# Patient Record
Sex: Male | Born: 1950 | Race: White | Hispanic: No | Marital: Married | Smoking: Former smoker
Health system: Southern US, Community
[De-identification: ages and names within clinical notes are randomized; demographics above are authoritative.]

## PROBLEM LIST (undated history)

## (undated) DIAGNOSIS — I2510 Atherosclerotic heart disease of native coronary artery without angina pectoris: Secondary | ICD-10-CM

## (undated) DIAGNOSIS — R351 Nocturia: Secondary | ICD-10-CM

## (undated) DIAGNOSIS — C679 Malignant neoplasm of bladder, unspecified: Secondary | ICD-10-CM

## (undated) DIAGNOSIS — Z8546 Personal history of malignant neoplasm of prostate: Secondary | ICD-10-CM

## (undated) DIAGNOSIS — K859 Acute pancreatitis without necrosis or infection, unspecified: Secondary | ICD-10-CM

## (undated) DIAGNOSIS — I959 Hypotension, unspecified: Secondary | ICD-10-CM

## (undated) DIAGNOSIS — D8989 Other specified disorders involving the immune mechanism, not elsewhere classified: Secondary | ICD-10-CM

## (undated) DIAGNOSIS — R350 Frequency of micturition: Secondary | ICD-10-CM

## (undated) DIAGNOSIS — Z8719 Personal history of other diseases of the digestive system: Secondary | ICD-10-CM

## (undated) DIAGNOSIS — R319 Hematuria, unspecified: Secondary | ICD-10-CM

## (undated) DIAGNOSIS — R3915 Urgency of urination: Secondary | ICD-10-CM

## (undated) DIAGNOSIS — N289 Disorder of kidney and ureter, unspecified: Secondary | ICD-10-CM

## (undated) DIAGNOSIS — I219 Acute myocardial infarction, unspecified: Secondary | ICD-10-CM

## (undated) HISTORY — DX: Personal history of other diseases of the digestive system: Z87.19

## (undated) HISTORY — PX: ILEOSTOMY: SHX1783

## (undated) HISTORY — PX: NEPHROSTOMY: SHX1014

## (undated) HISTORY — PX: ABDOMINAL SURGERY: SHX537

## (undated) HISTORY — DX: Atherosclerotic heart disease of native coronary artery without angina pectoris: I25.10

---

## 2011-03-03 HISTORY — PX: OTHER SURGICAL HISTORY: SHX169

## 2013-06-20 ENCOUNTER — Other Ambulatory Visit: Payer: Self-pay | Admitting: Urology

## 2013-06-23 ENCOUNTER — Encounter (HOSPITAL_BASED_OUTPATIENT_CLINIC_OR_DEPARTMENT_OTHER): Payer: Self-pay | Admitting: *Deleted

## 2013-06-23 NOTE — H&P (Signed)
Reason For Visit   Mr Aumiller presents today as a more urgent walk-in for concerns over a bladder mass and gross hematuria. Again, he was seen by myself on one occasion approximately 6 months ago for followup of prostate cancer. He was approximately 2 years status post seed implantation for favorable risk prostate cancer. His PSA had nadired at 1.0 but when we checked it, it was up to 2.4. He felt that that may represent a PSA bounce and that we would watch things with repeat in 6 months, which he is about to be due for. In the interim he came in here recently complaining of some increased urinary urgency and dysuria. It appears that he had at least a mild prostatitis and was treated with a course of antibiotics. His voiding symptoms improved, but then he developed some terminal gross hematuria while in [place], [place]. He presented to a local emergency room where a CT did reveal a 4 cm left lateral bladder mass consistent with a potential transitional cell carcinoma. I reviewed those CT images and concurred. There did not appear to be any evidence of obvious metastatic disease or extension outside the bladder. He is here for additional assessment, primarily with cystoscopy.     History of Present Illness   Past Gu Hx:  Mr. Labrosse presented in November of 2014 to establish with a local urologist for ongoing evaluation and followup of prostate cancer. Mr. Rostad is currently 63 years of age. He was diagnosed with prostate cancer in late 2012 and was under the care of a urologist and subsequently a radiologist oncologist. We currently do not have any of his official records, but he is an excellent historian. He was diagnosed with what sounds like low-risk/favorable clinical stage T1c prostate cancer. PSA was 3.5 and he apparently had a low-volume Gleason 6 cancer. He ended up electing active treatment and chose brachytherapy with Iodine-125. That procedure went well and was without obvious complications. He did  have typical increased irritative voiding symptoms, which settled down and for the most part he had minimal to no voiding complaints. He has had some very mild erectile dysfunction for which he will take an occasional Cialis. In early 2014 , PSA was apparently 1.0 which represented a Nadir level. He has been told in the past that he has some slight renal insufficiency. He also tells me he has passed some small stone-like material from his bladder. He has had no other significant urologic issues or concerns. He generally enjoys excellent health. He has not established with a primary care physician at this time.   Past Medical History Problems  1. History of gastroesophageal reflux (GERD) (V12.79) 2. History of prostate cancer (V10.46)  Surgical History Problems  1. History of Prostate Surgery  Current Meds 1. Cialis 20 MG Oral Tablet;  Therapy: (Recorded:13Nov2014) to Recorded 2. Doxycycline Monohydrate 100 MG Oral Tablet; Take 1 tablet twice daily;  Therapy: [id number] to (Evaluate:03Apr2015)  Requested for: [id number]; Last  Rx:24Mar2015 Ordered 3. SMZ-TMP DS 800-160 MG Oral Tablet; TAKE 1 TABLET TWICE DAILY;  Therapy: 01Apr2015 to (Evaluate:11Apr2015)  Requested for: 01Apr2015; Last  Rx:01Apr2015 Ordered 4. Tamsulosin HCl - 0.4 MG Oral Capsule; TAKE 1 CAPSULE Daily;  Therapy: 01Apr2015 to (Evaluate:01May2015)  Requested for: 01Apr2015; Last  Rx:01Apr2015 Ordered  Allergies Medication  1. No Known Drug Allergies  Family History Problems  1. Family history of prostate cancer (N82.95) : Father  Social History Problems  1. Alcohol use 2. Caffeine use (V49.89) 3. Married 4.  Never a smoker 5. Retired 86. Two children   sons  Review of Systems Genitourinary, constitutional, skin, eye, otolaryngeal, hematologic/lymphatic, cardiovascular, pulmonary, endocrine, musculoskeletal, gastrointestinal, neurological and psychiatric system(s) were reviewed and pertinent findings if present  are noted.  Genitourinary: urinary frequency, nocturia, hematuria and erectile dysfunction.  Gastrointestinal: heartburn.  Musculoskeletal: back pain.    Results/Data Urine [Data Includes: Last 1 Day]   20Apr2015 COLOR AMBER  APPEARANCE CLOUDY  SPECIFIC GRAVITY 1.020  pH 5.5  GLUCOSE NEG mg/dL BILIRUBIN NEG  KETONE NEG mg/dL BLOOD LARGE  PROTEIN 100 mg/dL UROBILINOGEN 0.2 mg/dL NITRITE NEG  LEUKOCYTE ESTERASE SMALL  SQUAMOUS EPITHELIAL/HPF RARE  WBC 11-20 WBC/hpf RBC TNTC RBC/hpf BACTERIA MODERATE  CRYSTALS NONE SEEN  CASTS NONE SEEN   Physical Exam Constitutional: Well nourished and well developed . No acute distress.  ENT:. The ears and nose are normal in appearance.  Neck: The appearance of the neck is normal and no neck mass is present.  Pulmonary: No respiratory distress and normal respiratory rhythm and effort.  Cardiovascular: Heart rate and rhythm are normal . No peripheral edema.  Abdomen: The abdomen is soft and nontender. No masses are palpated. No CVA tenderness. No hernias are palpable. No hepatosplenomegaly noted.  Rectal: Rectal exam demonstrates normal sphincter tone, no tenderness and no masses. Prostate size is estimated to be 20 g. Normal rectal tone, no rectal masses, prostate is smooth, symmetric and non-tender. The prostate has no nodularity and is not tender. The left seminal vesicle is nonpalpable. The right seminal vesicle is nonpalpable. The perineum is normal on inspection.  Genitourinary: Examination of the penis demonstrates no discharge, no masses, no lesions and a normal meatus. The scrotum is without lesions. The right epididymis is palpably normal and non-tender. The left epididymis is palpably normal and non-tender. The right testis is non-tender and without masses. The left testis is non-tender and without masses.  Lymphatics: The femoral and inguinal nodes are not enlarged or tender.  Skin: Normal skin turgor, no visible rash and no visible  skin lesions.  Neuro/Psych:. Mood and affect are appropriate.    Procedure  Procedure: Cystoscopy  Chaperone Present: laura.  Indication: Hematuria. Lower Urinary Tract Symptoms. Bladder Mass.  Informed Consent: Risks, benefits, and potential adverse events were discussed and informed consent was obtained from the patient . Specific risks including, but not limited to bleeding, infection, pain, allergic reaction etc. were explained.  Prep: The patient was prepped with betadine.  Anesthesia:. Local anesthesia was administered intraurethrally with 2% lidocaine jelly.  Antibiotic prophylaxis: Ciprofloxacin.  Procedure Note:  Urethral meatus:. No abnormalities.  Anterior urethra: No abnormalities.  Prostatic urethra:. The lateral and median prostatic lobes were enlarged.  Bladder: Visulization was obscured due to blood. A solitary tumor was visualized in the bladder. A nodular tumor was seen in the bladder measuring approximately 5 cm in size. This tumor was located on the left side of the bladder. The patient tolerated the procedure well.  Complications: None.    Assessment Assessed  1. Bladder mass (596.89) 2. Gross hematuria (599.71) 3. Prostate cancer (185) 4. Bladder cancer (188.9)  Plan Bladder mass  1. Cysto; Status:Complete;   Done: 20Apr2015 Health Maintenance  2. UA With REFLEX; [Do Not Release]; Status:Resulted - Requires Verification;   Done:  20Apr2015 10:54AM  Discussion/Summary   1. Bladder mass with gross hematuria. I explained to Mr Dudenhoeffer's wife that this mass has an exceedingly high likelihood of being a transitional cell carcinoma. It has a nodular appearance and  involves the left lateral wall of the bladder. It is well away from the ureteral orifices. Fortunately there does not appear to be obvious gross extension outside the bladder on CT imaging. Next assessment should be transurethral resection. We would be hopeful that this can be resected in its entirety and  we will do our best to get very accurate pathologic staging. We will consider mitomycin instillation of the bladder status post procedure after resection. Obviously, additional evaluation and/or treatment will depend primarily on staging and grading of this tumor.   2. History of adenocarcinoma of the prostate. He had a reasonable PSA response initially and then a bounce from 1 to 2.4. We will need to reassess his PSA. Now that he is 2 years out from seeds, we should start to see a definitive decline in his PSA and hopefully reaching levels that we typically expect after seed implantation.

## 2013-06-23 NOTE — Progress Notes (Signed)
NPO AFTER MN. ARRIVE AT 0915.  NEEDS HG AND PSA.  REVIEWED [facility] GUIDELINES .

## 2013-06-26 ENCOUNTER — Encounter (HOSPITAL_BASED_OUTPATIENT_CLINIC_OR_DEPARTMENT_OTHER)
Admission: RE | Disposition: A | Discharge status: Home / Self Care | Payer: Self-pay | Source: Ambulatory Visit | Attending: Urology

## 2013-06-26 ENCOUNTER — Encounter (HOSPITAL_BASED_OUTPATIENT_CLINIC_OR_DEPARTMENT_OTHER): Payer: BC Managed Care – PPO | Admitting: Anesthesiology

## 2013-06-26 ENCOUNTER — Ambulatory Visit (HOSPITAL_BASED_OUTPATIENT_CLINIC_OR_DEPARTMENT_OTHER)
Admission: RE | Admit: 2013-06-26 | Discharge: 2013-06-27 | Disposition: A | Discharge status: Home / Self Care | Payer: BC Managed Care – PPO | Source: Ambulatory Visit | Attending: Urology | Admitting: Urology

## 2013-06-26 ENCOUNTER — Encounter (HOSPITAL_BASED_OUTPATIENT_CLINIC_OR_DEPARTMENT_OTHER): Payer: Self-pay

## 2013-06-26 ENCOUNTER — Ambulatory Visit (HOSPITAL_BASED_OUTPATIENT_CLINIC_OR_DEPARTMENT_OTHER): Payer: BC Managed Care – PPO | Admitting: Anesthesiology

## 2013-06-26 DIAGNOSIS — C672 Malignant neoplasm of lateral wall of bladder: Secondary | ICD-10-CM | POA: Insufficient documentation

## 2013-06-26 DIAGNOSIS — Z8546 Personal history of malignant neoplasm of prostate: Secondary | ICD-10-CM | POA: Insufficient documentation

## 2013-06-26 DIAGNOSIS — Z923 Personal history of irradiation: Secondary | ICD-10-CM | POA: Insufficient documentation

## 2013-06-26 DIAGNOSIS — Z87891 Personal history of nicotine dependence: Secondary | ICD-10-CM | POA: Insufficient documentation

## 2013-06-26 DIAGNOSIS — C679 Malignant neoplasm of bladder, unspecified: Secondary | ICD-10-CM

## 2013-06-26 HISTORY — PX: TRANSURETHRAL RESECTION OF BLADDER TUMOR: SHX2575

## 2013-06-26 HISTORY — DX: Frequency of micturition: R35.0

## 2013-06-26 HISTORY — DX: Nocturia: R35.1

## 2013-06-26 HISTORY — DX: Urgency of urination: R39.15

## 2013-06-26 HISTORY — DX: Personal history of malignant neoplasm of prostate: Z85.46

## 2013-06-26 HISTORY — DX: Hematuria, unspecified: R31.9

## 2013-06-26 HISTORY — DX: Malignant neoplasm of bladder, unspecified: C67.9

## 2013-06-26 LAB — POCT HEMOGLOBIN-HEMACUE: Hemoglobin: 15.1 g/dL (ref 13.0–17.0)

## 2013-06-26 SURGERY — TURBT (TRANSURETHRAL RESECTION OF BLADDER TUMOR)
Anesthesia: General | Site: Bladder

## 2013-06-26 MED ORDER — LACTATED RINGERS IV SOLN
INTRAVENOUS | Status: DC
  Administered 2013-06-26 (×2): via INTRAVENOUS
  Filled 2013-06-26: qty 1000

## 2013-06-26 MED ORDER — KETOROLAC TROMETHAMINE 30 MG/ML IJ SOLN
15.0000 mg | Freq: Once | INTRAMUSCULAR | Status: AC | PRN
  Filled 2013-06-26: qty 1

## 2013-06-26 MED ORDER — MORPHINE SULFATE 4 MG/ML IJ SOLN
INTRAMUSCULAR | Status: AC
  Filled 2013-06-26: qty 1

## 2013-06-26 MED ORDER — CEFAZOLIN SODIUM 1-5 GM-% IV SOLN
1.0000 g | Freq: Three times a day (TID) | INTRAVENOUS | Status: AC
  Administered 2013-06-26 (×2): 1 g via INTRAVENOUS
  Filled 2013-06-26: qty 50

## 2013-06-26 MED ORDER — ACETAMINOPHEN 10 MG/ML IV SOLN
INTRAVENOUS | Status: DC | PRN
  Administered 2013-06-26: 1000 mg via INTRAVENOUS

## 2013-06-26 MED ORDER — ONDANSETRON HCL 4 MG/2ML IJ SOLN
INTRAMUSCULAR | Status: DC | PRN
  Administered 2013-06-26: 4 mg via INTRAVENOUS

## 2013-06-26 MED ORDER — DEXAMETHASONE SODIUM PHOSPHATE 4 MG/ML IJ SOLN
INTRAMUSCULAR | Status: DC | PRN
  Administered 2013-06-26: 10 mg via INTRAVENOUS

## 2013-06-26 MED ORDER — ROCURONIUM BROMIDE 100 MG/10ML IV SOLN
INTRAVENOUS | Status: DC | PRN
  Administered 2013-06-26: 40 mg via INTRAVENOUS
  Administered 2013-06-26 (×3): 10 mg via INTRAVENOUS

## 2013-06-26 MED ORDER — LIDOCAINE HCL (CARDIAC) 20 MG/ML IV SOLN
INTRAVENOUS | Status: DC | PRN
  Administered 2013-06-26: 100 mg via INTRAVENOUS

## 2013-06-26 MED ORDER — GLYCOPYRROLATE 0.2 MG/ML IJ SOLN
INTRAMUSCULAR | Status: DC | PRN
  Administered 2013-06-26: .8 mg via INTRAVENOUS
  Administered 2013-06-26: 0.2 mg via INTRAVENOUS

## 2013-06-26 MED ORDER — BELLADONNA ALKALOIDS-OPIUM 16.2-60 MG RE SUPP
RECTAL | Status: AC
  Filled 2013-06-26: qty 1

## 2013-06-26 MED ORDER — PROMETHAZINE HCL 25 MG/ML IJ SOLN
6.2500 mg | INTRAMUSCULAR | Status: DC | PRN
  Filled 2013-06-26: qty 1

## 2013-06-26 MED ORDER — KCL IN DEXTROSE-NACL 20-5-0.45 MEQ/L-%-% IV SOLN
INTRAVENOUS | Status: DC
  Administered 2013-06-26 – 2013-06-27 (×2): via INTRAVENOUS
  Filled 2013-06-26: qty 1000

## 2013-06-26 MED ORDER — MORPHINE SULFATE 2 MG/ML IJ SOLN
2.0000 mg | INTRAMUSCULAR | Status: DC | PRN
  Administered 2013-06-26: 2 mg via INTRAVENOUS
  Filled 2013-06-26: qty 2

## 2013-06-26 MED ORDER — NEOSTIGMINE METHYLSULFATE 1 MG/ML IJ SOLN
INTRAMUSCULAR | Status: DC | PRN
  Administered 2013-06-26: 5 mg via INTRAVENOUS

## 2013-06-26 MED ORDER — CIPROFLOXACIN IN D5W 400 MG/200ML IV SOLN
400.0000 mg | Freq: Two times a day (BID) | INTRAVENOUS | Status: AC
  Filled 2013-06-26: qty 200

## 2013-06-26 MED ORDER — CIPROFLOXACIN IN D5W 400 MG/200ML IV SOLN
400.0000 mg | INTRAVENOUS | Status: AC
  Administered 2013-06-26: 400 mg via INTRAVENOUS
  Filled 2013-06-26: qty 200

## 2013-06-26 MED ORDER — SUCCINYLCHOLINE CHLORIDE 20 MG/ML IJ SOLN
INTRAMUSCULAR | Status: DC | PRN
  Administered 2013-06-26: 120 mg via INTRAVENOUS

## 2013-06-26 MED ORDER — OXYCODONE-ACETAMINOPHEN 5-325 MG PO TABS
1.0000 | ORAL_TABLET | ORAL | Status: DC | PRN
  Administered 2013-06-26 – 2013-06-27 (×2): 1 via ORAL
  Filled 2013-06-26: qty 2

## 2013-06-26 MED ORDER — PROPOFOL 10 MG/ML IV BOLUS
INTRAVENOUS | Status: DC | PRN
  Administered 2013-06-26: 200 mg via INTRAVENOUS

## 2013-06-26 MED ORDER — FENTANYL CITRATE 0.05 MG/ML IJ SOLN
INTRAMUSCULAR | Status: AC
  Filled 2013-06-26: qty 4

## 2013-06-26 MED ORDER — BELLADONNA ALKALOIDS-OPIUM 16.2-60 MG RE SUPP
RECTAL | Status: DC | PRN
  Administered 2013-06-26: 1 via RECTAL

## 2013-06-26 MED ORDER — SODIUM CHLORIDE 0.9 % IR SOLN
Status: DC | PRN
  Administered 2013-06-26: 6000 mL via INTRAVESICAL
  Administered 2013-06-26: 31000 mL via INTRAVESICAL

## 2013-06-26 MED ORDER — OXYCODONE-ACETAMINOPHEN 5-325 MG PO TABS
ORAL_TABLET | ORAL | Status: AC
  Filled 2013-06-26: qty 1

## 2013-06-26 MED ORDER — FENTANYL CITRATE 0.05 MG/ML IJ SOLN
25.0000 ug | INTRAMUSCULAR | Status: DC | PRN
  Filled 2013-06-26: qty 1

## 2013-06-26 MED ORDER — FENTANYL CITRATE 0.05 MG/ML IJ SOLN
INTRAMUSCULAR | Status: DC | PRN
  Administered 2013-06-26 (×4): 50 ug via INTRAVENOUS

## 2013-06-26 MED ORDER — MIDAZOLAM HCL 5 MG/5ML IJ SOLN
INTRAMUSCULAR | Status: DC | PRN
  Administered 2013-06-26: 2 mg via INTRAVENOUS

## 2013-06-26 MED ORDER — OXYBUTYNIN CHLORIDE 5 MG PO TABS
5.0000 mg | ORAL_TABLET | Freq: Three times a day (TID) | ORAL | Status: DC | PRN
  Administered 2013-06-26 – 2013-06-27 (×2): 5 mg via ORAL
  Filled 2013-06-26: qty 1

## 2013-06-26 MED ORDER — FENTANYL CITRATE 0.05 MG/ML IJ SOLN
INTRAMUSCULAR | Status: AC
  Filled 2013-06-26: qty 2

## 2013-06-26 MED ORDER — STERILE WATER FOR IRRIGATION IR SOLN: Status: DC | PRN

## 2013-06-26 MED ORDER — ONDANSETRON HCL 4 MG/2ML IJ SOLN
4.0000 mg | INTRAMUSCULAR | Status: DC | PRN
  Filled 2013-06-26: qty 2

## 2013-06-26 MED ORDER — FENTANYL CITRATE 0.05 MG/ML IJ SOLN
25.0000 ug | INTRAMUSCULAR | Status: DC | PRN
Start: 2013-06-26 — End: 2013-06-27
  Administered 2013-06-26 (×4): 25 ug via INTRAVENOUS
  Filled 2013-06-26: qty 1

## 2013-06-26 MED ORDER — OXYBUTYNIN CHLORIDE 5 MG PO TABS
ORAL_TABLET | ORAL | Status: AC
  Filled 2013-06-26: qty 1

## 2013-06-26 MED ORDER — LIDOCAINE HCL 2 % EX GEL
CUTANEOUS | Status: DC | PRN
  Administered 2013-06-26: 1

## 2013-06-26 MED ORDER — MIDAZOLAM HCL 2 MG/2ML IJ SOLN
INTRAMUSCULAR | Status: AC
  Filled 2013-06-26: qty 2

## 2013-06-26 MED ORDER — KETOROLAC TROMETHAMINE 30 MG/ML IJ SOLN
15.0000 mg | Freq: Once | INTRAMUSCULAR | Status: DC | PRN
  Filled 2013-06-26: qty 1

## 2013-06-26 SURGICAL SUPPLY — 31 items
BAG DRAIN URO-CYSTO SKYTR STRL (DRAIN) ×3 IMPLANT
BAG URINE DRAINAGE (UROLOGICAL SUPPLIES) ×3 IMPLANT
BAG URINE LEG 19OZ MD ST LTX (BAG) IMPLANT
CANISTER SUCT LVC 12 LTR MEDI- (MISCELLANEOUS) ×9 IMPLANT
CATH FOLEY 2WAY SLVR  5CC 20FR (CATHETERS)
CATH FOLEY 2WAY SLVR  5CC 22FR (CATHETERS)
CATH FOLEY 2WAY SLVR 30CC 22FR (CATHETERS) ×3 IMPLANT
CATH FOLEY 2WAY SLVR 5CC 20FR (CATHETERS) IMPLANT
CATH FOLEY 2WAY SLVR 5CC 22FR (CATHETERS) IMPLANT
CLOTH BEACON ORANGE TIMEOUT ST (SAFETY) ×3 IMPLANT
DRAPE CAMERA CLOSED 9X96 (DRAPES) ×3 IMPLANT
ELECT BUTTON BIOP 24F 90D PLAS (MISCELLANEOUS) IMPLANT
ELECT LOOP HF 26F 30D .35MM (CUTTING LOOP) IMPLANT
ELECT LOOP MED HF 24F 12D CBL (CLIP) ×3 IMPLANT
ELECT NEEDLE 45D HF 24-28F 12D (CUTTING LOOP) IMPLANT
ELECT REM PT RETURN 9FT ADLT (ELECTROSURGICAL)
ELECTRODE REM PT RTRN 9FT ADLT (ELECTROSURGICAL) IMPLANT
EVACUATOR MICROVAS BLADDER (UROLOGICAL SUPPLIES) IMPLANT
GLOVE BIO SURGEON STRL SZ7.5 (GLOVE) ×3 IMPLANT
GOWN STRL REIN XL XLG (GOWN DISPOSABLE) IMPLANT
GOWN STRL REUS W/ TWL LRG LVL3 (GOWN DISPOSABLE) ×1 IMPLANT
GOWN STRL REUS W/TWL LRG LVL3 (GOWN DISPOSABLE) ×2
GOWN STRL REUS W/TWL XL LVL3 (GOWN DISPOSABLE) ×3 IMPLANT
GOWN XL W/COTTON TOWEL STD (GOWNS) IMPLANT
HOLDER FOLEY CATH W/STRAP (MISCELLANEOUS) ×3 IMPLANT
LOOP CUTTING 24FR OLYMPUS (CUTTING LOOP) IMPLANT
LOOP ELECTRODE 28FR (MISCELLANEOUS) IMPLANT
PACK CYSTOSCOPY (CUSTOM PROCEDURE TRAY) ×3 IMPLANT
PLUG CATH AND CAP STER (CATHETERS) IMPLANT
SET ASPIRATION TUBING (TUBING) ×3 IMPLANT
SYRINGE IRR TOOMEY STRL 70CC (SYRINGE) ×3 IMPLANT

## 2013-06-26 NOTE — Anesthesia Preprocedure Evaluation (Signed)
Anesthesia Evaluation  Patient identified by MRN, date of birth, ID band Patient awake    Reviewed: Allergy & Precautions, H&P , NPO status , Patient's Chart, lab work & pertinent test results  Airway Mallampati: II TM Distance: >3 FB Neck ROM: Full    Dental no notable dental hx.    Pulmonary neg pulmonary ROS, former smoker,  breath sounds clear to auscultation  Pulmonary exam normal       Cardiovascular negative cardio ROS  Rhythm:Regular Rate:Normal     Neuro/Psych negative neurological ROS  negative psych ROS   GI/Hepatic negative GI ROS, Neg liver ROS,   Endo/Other  negative endocrine ROS  Renal/GU Bladder Ca  negative genitourinary   Musculoskeletal negative musculoskeletal ROS (+)   Abdominal   Peds negative pediatric ROS (+)  Hematology negative hematology ROS (+)   Anesthesia Other Findings   Reproductive/Obstetrics negative OB ROS                           Anesthesia Physical Anesthesia Plan  ASA: II  Anesthesia Plan: General   Post-op Pain Management:    Induction: Intravenous  Airway Management Planned: LMA  Additional Equipment:   Intra-op Plan:   Post-operative Plan:   Informed Consent: I have reviewed the patients History and Physical, chart, labs and discussed the procedure including the risks, benefits and alternatives for the proposed anesthesia with the patient or authorized representative who has indicated his/her understanding and acceptance.   Dental advisory given  Plan Discussed with: CRNA and Surgeon  Anesthesia Plan Comments:         Anesthesia Quick Evaluation

## 2013-06-26 NOTE — Anesthesia Postprocedure Evaluation (Signed)
  Anesthesia Post-op Note  Patient: Daniel Peterson  Procedure(s) Performed: Procedure(s) (LRB): TRANSURETHRAL RESECTION OF BLADDER TUMOR (TURBT), POSSIBLE INSTILLATION OF MITOMYCIN C (N/A)  Patient Location: PACU  Anesthesia Type: General  Level of Consciousness: awake and alert   Airway and Oxygen Therapy: Patient Spontanous Breathing  Post-op Pain: mild  Post-op Assessment: Post-op Vital signs reviewed, Patient's Cardiovascular Status Stable, Respiratory Function Stable, Patent Airway and No signs of Nausea or vomiting  Last Vitals:  Filed Vitals:   06/26/13 1245  BP: 148/85  Pulse: 60  Temp:   Resp: 14    Post-op Vital Signs: stable   Complications: No apparent anesthesia complications

## 2013-06-26 NOTE — Transfer of Care (Signed)
Immediate Anesthesia Transfer of Care Note  Patient: Daniel Peterson  Procedure(s) Performed: Procedure(s) (LRB): TRANSURETHRAL RESECTION OF BLADDER TUMOR (TURBT), POSSIBLE INSTILLATION OF MITOMYCIN C (N/A)  Patient Location: PACU  Anesthesia Type: General  Level of Consciousness: awake, alert  and oriented  Airway & Oxygen Therapy: Patient Spontanous Breathing and Patient connected to face mask oxygen  Post-op Assessment: Report given to PACU RN and Post -op Vital signs reviewed and stable  Post vital signs: Reviewed and stable  Complications: No apparent anesthesia complications

## 2013-06-26 NOTE — Interval H&P Note (Signed)
History and Physical Interval Note:  06/26/2013 9:42 AM  Daniel Peterson  has presented today for surgery, with the diagnosis of Bladder Cancer  The various methods of treatment have been discussed with the patient and family. After consideration of risks, benefits and other options for treatment, the patient has consented to  Procedure(s) with comments: TRANSURETHRAL RESECTION OF BLADDER TUMOR (TURBT), POSSIBLE INSTILLATION OF MITOMYCIN C (N/A) - **OUTPATIENT WITH OBSERVATION** (POSSIBLE)  as a surgical intervention .  The patient's history has been reviewed, patient examined, no change in status, stable for surgery.  I have reviewed the patient's chart and labs.  Questions were answered to the patient's satisfaction.     [name]

## 2013-06-26 NOTE — Op Note (Signed)
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  Anesthesia: Gen.  Indications: Patient is 63 years of age and develop gross hematuria while traveling to [place]. He also has a history of prostate cancer status post seed implantation 2 years ago. His part of his assessment for hematuria and underwent CT imaging which revealed a large mass on the left lateral wall of his bladder. This appeared consistent with a transitional cell carcinoma. There is no evidence of locally advanced or metastatic disease. The patient socially underwent flexible cystoscopy in our office. A solitary large tumors noted on the left lateral wall the bladder. This was somewhat nodular in appearance. It appeared away from the ureteral orifice. He presents now for endoscopic reassessment and attempt at resection of this tumor.     Technique and findings: Patient was brought the operating room where he had successful induction of general endotracheal anesthesia. He requested full paralysis from anesthesia secondary lateral wall involvement. He was placed in lithotomy position and prepped and draped in usual manner. He received perioperative antibiotics and placement of PAS compression boots. Appropriate surgical timeout was performed. Repeat cystoscopic analysis revealed a very large tumor that appeared nodule liver. He had a very broad base and was in essence involving the entire left lateral wall of the bladder. The area of involvement appeared to be about 7 x 6 cm. It encroached on the bladder neck and extended back towards the posterior wall. Right ureteral orifice could be easily visualized there was difficulty in seeing the exact position on the left side. There did not been any previous hydronephrosis noted. Careful meticulous resection of his very large tumor was undertaken. Portions of this appeared necrotic.  This was carried down to muscular fibers. It was my impression that this is likely to be muscle invasive given the endoscopic findings. We continued to resect from anterior back towards a posterior approach and out to the bladder neck. It appeared that about 30 g of tissue was resected. I continued not to be able to clearly identify the left renal orifice. Hemostasis was quite good and urine was light pink at the completion of the procedure. I did not appreciate any evidence of obvious bladder perforation. Given what I felt was likely to be muscle invasive disease I elected not to place mitomycin in his bladder postoperatively.  I felt that all gross tumor was resected but undoubtedly there is likely to be residual cancer within the base if indeed this is muscle invasive disease. A 22 French three-way Foley catheter was inserted and continuous bladder irrigation with saline will be undertaken. Urine was a very light pink color. Suprapubic area was soft at the completion of the resection. No obvious complications or problems occurred and the patient's right recovery room stable condition.

## 2013-06-26 NOTE — Anesthesia Procedure Notes (Signed)
Procedure Name: Intubation Date/Time: 06/26/2013 10:38 AM Performed by: Mechele Claude Pre-anesthesia Checklist: Patient identified, Emergency Drugs available, Suction available and Patient being monitored Patient Re-evaluated:Patient Re-evaluated prior to inductionOxygen Delivery Method: Circle System Utilized Preoxygenation: Pre-oxygenation with 100% oxygen Intubation Type: IV induction Ventilation: Mask ventilation without difficulty Laryngoscope Size: Mac and 4 Grade View: Grade I Tube type: Oral Tube size: 8.0 mm Number of attempts: 1 Airway Equipment and Method: stylet Placement Confirmation: ETT inserted through vocal cords under direct vision,  positive ETCO2 and breath sounds checked- equal and bilateral Secured at: 23 cm Tube secured with: Tape Dental Injury: Teeth and Oropharynx as per pre-operative assessment

## 2013-06-27 ENCOUNTER — Encounter (HOSPITAL_BASED_OUTPATIENT_CLINIC_OR_DEPARTMENT_OTHER): Payer: Self-pay | Admitting: Urology

## 2013-06-27 LAB — BASIC METABOLIC PANEL
BUN: 13 mg/dL (ref 6–23)
CO2: 27 mEq/L (ref 19–32)
CREATININE: 1.26 mg/dL (ref 0.50–1.35)
Calcium: 8.7 mg/dL (ref 8.4–10.5)
Chloride: 102 mEq/L (ref 96–112)
GFR, EST AFRICAN AMERICAN: 68 mL/min — AB (ref >90)
GFR, EST NON AFRICAN AMERICAN: 59 mL/min — AB (ref >90)
Glucose, Bld: 134 mg/dL — ABNORMAL HIGH (ref 70–99)
POTASSIUM: 4.3 meq/L (ref 3.7–5.3)
Sodium: 139 mEq/L (ref 137–147)

## 2013-06-27 LAB — PSA: PSA: 0.65 ng/mL (ref <=4.00)

## 2013-06-27 LAB — HEMOGLOBIN AND HEMATOCRIT, BLOOD
HCT: 39.7 % (ref 39.0–52.0)
Hemoglobin: 13.3 g/dL (ref 13.0–17.0)

## 2013-06-27 MED ORDER — OXYCODONE-ACETAMINOPHEN 5-325 MG PO TABS
ORAL_TABLET | ORAL | Status: AC
  Filled 2013-06-27: qty 2

## 2013-06-27 MED ORDER — OXYCODONE-ACETAMINOPHEN 5-325 MG PO TABS
ORAL_TABLET | ORAL | Status: AC
  Filled 2013-06-27: qty 1

## 2013-06-27 MED ORDER — OXYBUTYNIN CHLORIDE 5 MG PO TABS: 5.0000 mg | ORAL_TABLET | Freq: Three times a day (TID) | ORAL | Status: DC | PRN

## 2013-06-27 MED ORDER — HYDROCODONE-ACETAMINOPHEN 5-325 MG PO TABS: 1.0000 | ORAL_TABLET | Freq: Four times a day (QID) | ORAL | Status: DC | PRN

## 2013-06-27 MED ORDER — OXYBUTYNIN CHLORIDE 5 MG PO TABS
ORAL_TABLET | ORAL | Status: AC
  Filled 2013-06-27: qty 1

## 2013-06-27 NOTE — Discharge Summary (Signed)
Patient ID: Daniel Peterson MRN: [id number] DOB/AGE: 64-Jan-1952 64 y.o.  Admit date: 06/26/2013 Discharge date: 06/27/2013    Discharge Diagnoses:   Present on Admission:  **None**  Consults:  None    Discharge Medications:   Medication List         HYDROcodone-acetaminophen 5-325 MG per tablet  Commonly known as:  NORCO/VICODIN  Take 1-2 tablets by mouth every 6 (six) hours as needed.     oxybutynin 5 MG tablet  Commonly known as:  DITROPAN  Take 1 tablet (5 mg total) by mouth every 8 (eight) hours as needed for bladder spasms.         Significant Diagnostic Studies:  No results found.    Hospital Course:  Active Problems:   Bladder cancer  patient had a large mass involving the majority of the left lateral wall of his bladder. Endoscopically this appeared high-grade nodular and probably muscle invasive. Gross tumor was resected although certainly there is an exceedingly high likelihood of residual at least microscopic disease. The patient was kept overnight for observation and supportive care. His urine remained clear to very light pain. He's had no obvious complications or problems and is had uneventful evening. He will be discharged home with his catheter to a leg bag with planned for voiding trial in several days.  Day of Discharge BP 113/71  Pulse 63  Temp(Src) 97.8 F (36.6 C) (Oral)  Resp 16  Ht 6\' 2"  (1.88 m)  Wt 234 lb (106.142 kg)  BMI 30.03 kg/m2  SpO2 97%  Multiple well-nourished male in no acute distress. Respiratory: Normal effort Cardiac: Regular rate and rhythm Abdomen: Soft Genitourinary: Foley indwelling draining clear urine Extremities: No tenderness or edema Results for orders placed during the hospital encounter of 06/26/13 (from the past 24 hour(s))  PSA     Status: None   Collection Time    06/26/13  9:55 AM      Result Value Ref Range   PSA 0.65  <=4.00 ng/mL  POCT HEMOGLOBIN-HEMACUE     Status: None   Collection Time    06/26/13  10:04 AM      Result Value Ref Range   Hemoglobin 15.1  13.0 - 17.0 g/dL  BASIC METABOLIC PANEL     Status: Abnormal   Collection Time    06/27/13  5:28 AM      Result Value Ref Range   Sodium 139  137 - 147 mEq/L   Potassium 4.3  3.7 - 5.3 mEq/L   Chloride 102  96 - 112 mEq/L   CO2 27  19 - 32 mEq/L   Glucose, Bld 134 (*) 70 - 99 mg/dL   BUN 13  6 - 23 mg/dL   Creatinine, Ser 1.26  0.50 - 1.35 mg/dL   Calcium 8.7  8.4 - 10.5 mg/dL   GFR calc non Af Amer 59 (*) >90 mL/min   GFR calc Af Amer 68 (*) >90 mL/min  HEMOGLOBIN AND HEMATOCRIT, BLOOD     Status: None   Collection Time    06/27/13  5:28 AM      Result Value Ref Range   Hemoglobin 13.3  13.0 - 17.0 g/dL   HCT 39.7  39.0 - 52.0 %

## 2013-06-27 NOTE — Discharge Instructions (Signed)
Transurethral Resection of Bladder Tumor (TURBT) ° ° °Definition: ° Transurethral Resection of the Bladder Tumor is a surgical procedure used to diagnose and remove tumors within the bladder. TURBT is the most common treatment for early stage bladder cancer. ° °General instructions: °   ° Your recent bladder surgery requires very little post hospital care but some definite precautions. ° °Despite the fact that no skin incisions were used, the area around the bladder incisions are raw and covered with scabs to promote healing and prevent bleeding. Certain precautions are needed to insure that the scabs are not disturbed over the next 2-4 weeks while the healing proceeds. ° °Because the raw surface inside your bladder and the irritating effects of urine you may expect frequency of urination and/or urgency (a stronger desire to urinate) and perhaps even getting up at night more often. This will usually resolve or improve slowly over the healing period. You may see some blood in your urine over the first 6 weeks. Do not be alarmed, even if the urine was clear for a while. Get off your feet and drink lots of fluids until clearing occurs. If you start to pass clots or don't improve call us. ° °Diet: ° °You may return to your normal diet immediately. Because of the raw surface of your bladder, alcohol, spicy foods, foods high in acid and drinks with caffeine may cause irritation or frequency and should be used in moderation. To keep your urine flowing freely and avoid constipation, drink plenty of fluids during the day (8-10 glasses). Tip: Avoid cranberry juice because it is very acidic. ° °Activity: ° °Your physical activity doesn't need to be restricted. However, if you are very active, you may see some blood in the urine. We suggest that you reduce your activity under the circumstances until the bleeding has stopped. ° °Bowels: ° °It is important to keep your bowels regular during the postoperative period. Straining  with bowel movements can cause bleeding. A bowel movement every other day is reasonable. Use a mild laxative if needed, such as milk of magnesia 2-3 tablespoons, or 2 Dulcolax tablets. Call if you continue to have problems. If you had been taking narcotics for pain, before, during or after your surgery, you may be constipated. Take a laxative if necessary. ° ° ° °Medication: ° °You should resume your pre-surgery medications unless told not to. In addition you may be given an antibiotic to prevent or treat infection. Antibiotics are not always necessary. All medication should be taken as prescribed until the bottles are finished unless you are having an unusual reaction to one of the drugs. ° ° ° ° °Post Anesthesia Home Care Instructions ° °Activity: °Get plenty of rest for the remainder of the day. A responsible adult should stay with you for 24 hours following the procedure.  °For the next 24 hours, DO NOT: °-Drive a car °-Operate machinery °-Drink alcoholic beverages °-Take any medication unless instructed by your physician °-Make any legal decisions or sign important papers. ° °Meals: °Start with liquid foods such as gelatin or soup. Progress to regular foods as tolerated. Avoid greasy, spicy, heavy foods. If nausea and/or vomiting occur, drink only clear liquids until the nausea and/or vomiting subsides. Call your physician if vomiting continues. ° °Special Instructions/Symptoms: °Your throat may feel dry or sore from the anesthesia or the breathing tube placed in your throat during surgery. If this causes discomfort, gargle with warm salt water. The discomfort should disappear within 24 hours. ° °

## 2013-11-04 ENCOUNTER — Encounter (HOSPITAL_COMMUNITY): Payer: Self-pay | Admitting: Emergency Medicine

## 2013-11-04 ENCOUNTER — Emergency Department (HOSPITAL_COMMUNITY)
Admission: EM | Admit: 2013-11-04 | Discharge: 2013-11-04 | Disposition: A | Discharge status: Home / Self Care | Payer: BC Managed Care – PPO | Attending: Emergency Medicine | Admitting: Emergency Medicine

## 2013-11-04 DIAGNOSIS — Z87448 Personal history of other diseases of urinary system: Secondary | ICD-10-CM | POA: Insufficient documentation

## 2013-11-04 DIAGNOSIS — Z8546 Personal history of malignant neoplasm of prostate: Secondary | ICD-10-CM | POA: Insufficient documentation

## 2013-11-04 DIAGNOSIS — Z87891 Personal history of nicotine dependence: Secondary | ICD-10-CM | POA: Diagnosis not present

## 2013-11-04 DIAGNOSIS — C679 Malignant neoplasm of bladder, unspecified: Secondary | ICD-10-CM | POA: Diagnosis not present

## 2013-11-04 DIAGNOSIS — T839XXA Unspecified complication of genitourinary prosthetic device, implant and graft, initial encounter: Secondary | ICD-10-CM

## 2013-11-04 DIAGNOSIS — Y846 Urinary catheterization as the cause of abnormal reaction of the patient, or of later complication, without mention of misadventure at the time of the procedure: Secondary | ICD-10-CM | POA: Insufficient documentation

## 2013-11-04 DIAGNOSIS — T83091A Other mechanical complication of indwelling urethral catheter, initial encounter: Secondary | ICD-10-CM | POA: Insufficient documentation

## 2013-11-04 DIAGNOSIS — Z79899 Other long term (current) drug therapy: Secondary | ICD-10-CM | POA: Insufficient documentation

## 2013-11-04 LAB — URINALYSIS, ROUTINE W REFLEX MICROSCOPIC
BILIRUBIN URINE: NEGATIVE
GLUCOSE, UA: NEGATIVE mg/dL
Ketones, ur: NEGATIVE mg/dL
Nitrite: NEGATIVE
PH: 5 (ref 5.0–8.0)
Protein, ur: 100 mg/dL — AB
Specific Gravity, Urine: 1.018 (ref 1.005–1.030)
Urobilinogen, UA: 0.2 mg/dL (ref 0.0–1.0)

## 2013-11-04 LAB — URINE MICROSCOPIC-ADD ON

## 2013-11-04 MED ORDER — OXYCODONE-ACETAMINOPHEN 5-325 MG PO TABS
1.0000 | ORAL_TABLET | Freq: Once | ORAL | Status: AC
  Administered 2013-11-04: 1 via ORAL
  Filled 2013-11-04: qty 1

## 2013-11-04 NOTE — ED Provider Notes (Signed)
CSN: [id number]     Arrival date & time 11/04/13  1017 History   First MD Initiated Contact with Patient 11/04/13 [phone]     Chief Complaint  Patient presents with  . leaking foiley bag      (Consider location/radiation/quality/duration/timing/severity/associated sxs/prior Treatment) HPI The patient presents with complaint of leaking Foley catheter. The patient had biopsies in uroscopy done yesterday. This was done in [place]. The patient is status post prostate cancer with last actual TURP having been in April. There has been no blood or clots observed. The patient has put out at least 600 ml urine overnight he estimates. The bag filled several times, however he had episodes of overflow when the bladder seemed to be having spasms. The patient denies he is having abdominal pain or distention type pressure. There've been no fevers no back pain no abdominal pain. Aside from intermittent spasm and overflow issues the patient is feeling well.   Past Medical History  Diagnosis Date  . History of prostate cancer     s/p  radioactive seed implants  in 2013  . Bladder cancer   . Frequency of urination   . Urgency of urination   . Hematuria   . Nocturia    Past Surgical History  Procedure Laterality Date  . Radioactive prostate seed implants  2013  . Transurethral resection of bladder tumor N/A 06/26/2013    Procedure: TRANSURETHRAL RESECTION OF BLADDER TUMOR (TURBT), POSSIBLE INSTILLATION OF MITOMYCIN C;  Surgeon: [name], MD;  Location: [facility];  Service: Urology;  Laterality: N/A;  **OUTPATIENT WITH OBSERVATION** (POSSIBLE)    No family history on file. History  Substance Use Topics  . Smoking status: Former Smoker    Types: Cigarettes    Quit date: 06/24/1978  . Smokeless tobacco: Never Used  . Alcohol Use: No    Review of Systems Constitutional; no fevers no chills or general malaise Cardiovascular: No chest pain lightheadedness Respiratory; no  shortness of breath GI; no vomiting no diarrhea no abdominal pain Musculoskeletal; no back pain no lower extremities swelling or pain Skin no unusual rashes. Remainder review of systems negative   Allergies  Review of patient's allergies indicates no known allergies.  Home Medications   Prior to Admission medications   Medication Sig Start Date End Date Taking? Authorizing Provider  HYDROcodone-acetaminophen (NORCO/VICODIN) 5-325 MG per tablet Take 1-2 tablets by mouth every 6 (six) hours as needed. 06/27/13   [name], MD  oxybutynin (DITROPAN) 5 MG tablet Take 1 tablet (5 mg total) by mouth every 8 (eight) hours as needed for bladder spasms. 06/27/13   [name], MD   BP 126/86  Pulse 82  Temp(Src) 98.4 F (36.9 C) (Oral)  Resp 16  Ht 6\' 2"  (1.88 m)  Wt 208 lb (94.348 kg)  BMI 26.69 kg/m2  SpO2 100% Physical Exam  Constitutional: He is oriented to person, place, and time. He appears well-developed and well-nourished.  HENT:  Head: Normocephalic and atraumatic.  Eyes: Conjunctivae and EOM are normal.  Neck: Neck supple.  Cardiovascular: Normal rate, regular rhythm, normal heart sounds and intact distal pulses.   Pulmonary/Chest: Effort normal and breath sounds normal.  Abdominal: Soft. Bowel sounds are normal. He exhibits no distension. There is no tenderness.  Genitourinary:  Examination the penis the testicles is normal aside from a Foley catheter in place. There is no drainage discharge or blood around the catheter.  within the catheter bag is clear pale yellow  urine.  Musculoskeletal: Normal range of motion. He exhibits no edema.  Neurological: He is alert and oriented to person, place, and time. He has normal strength. Coordination normal. GCS eye subscore is 4. GCS verbal subscore is 5. GCS motor subscore is 6.  Skin: Skin is warm, dry and intact.  Psychiatric: He has a normal mood and affect.     ED Course  Procedures (including critical care time) Labs  Review Labs Reviewed - No data to display  Imaging Review No results found.   EKG Interpretation None      MDM   Final diagnoses:  Foley catheter problem, initial encounter  Malignant neoplasm of urinary bladder, unspecified site   Clear urine is draining from the Foley bag upon reassessment at 933 am. There is no blood present within the bag no clots. The nurse reported a small amount of red blood upon passage of the Foley catheter. But at this point time there appears to be no obstruction with good urine output very clean in appearance. The patient is instructed to continue with the followup plan for her this upcoming Tuesday.    Charlesetta Shanks, MD 11/04/13 [phone]

## 2013-11-04 NOTE — ED Notes (Signed)
The pt has a foley cath placed yesterday.  The cath  Is leaking.  It is draining ok just leaking.  He has prostate cancer

## 2013-11-04 NOTE — ED Notes (Signed)
Cystoscopy yesterday and foley placement, currently being treated for bladder ca.

## 2013-11-04 NOTE — ED Notes (Signed)
Took out old foley leg bag it was bagging up

## 2013-11-04 NOTE — Discharge Instructions (Signed)
Foley Catheter Care °A Foley catheter is a soft, flexible tube that is placed into the bladder to drain urine. A Foley catheter may be inserted if: °· You leak urine or are not able to control when you urinate (urinary incontinence). °· You are not able to urinate when you need to (urinary retention). °· You had prostate surgery or surgery on the genitals. °· You have certain medical conditions, such as multiple sclerosis, dementia, or a spinal cord injury. °If you are going home with a Foley catheter in place, follow the instructions below. °TAKING CARE OF THE CATHETER °1. Wash your hands with soap and water. °2. Using mild soap and warm water on a clean washcloth: °· Clean the area on your body closest to the catheter insertion site using a circular motion, moving away from the catheter. Never wipe toward the catheter because this could sweep bacteria up into the urethra and cause infection. °· Remove all traces of soap. Pat the area dry with a clean towel. For males, reposition the foreskin. °3. Attach the catheter to your leg so there is no tension on the catheter. Use adhesive tape or a leg strap. If you are using adhesive tape, remove any sticky residue left behind by the previous tape you used. °4. Keep the drainage bag below the level of the bladder, but keep it off the floor. °5. Check throughout the day to be sure the catheter is working and urine is draining freely. Make sure the tubing does not become kinked. °6. Do not pull on the catheter or try to remove it. Pulling could damage internal tissues. °TAKING CARE OF THE DRAINAGE BAGS °You will be given two drainage bags to take home. One is a large overnight drainage bag, and the other is a smaller leg bag that fits underneath clothing. You may wear the overnight bag at any time, but you should never wear the smaller leg bag at night. Follow the instructions below for how to empty, change, and clean your drainage bags. °Emptying the Drainage Bag °You must  empty your drainage bag when it is  -½ full or at least 2-3 times a day. °1. Wash your hands with soap and water. °2. Keep the drainage bag below your hips, below the level of your bladder. This stops urine from going back into the tubing and into your bladder. °3. Hold the dirty bag over the toilet or a clean container. °4. Open the pour spout at the bottom of the bag and empty the urine into the toilet or container. Do not let the pour spout touch the toilet, container, or any other surface. Doing so can place bacteria on the bag, which can cause an infection. °5. Clean the pour spout with a gauze pad or cotton ball that has rubbing alcohol on it. °6. Close the pour spout. °7. Attach the bag to your leg with adhesive tape or a leg strap. °8. Wash your hands well. °Changing the Drainage Bag °Change your drainage bag once a month or sooner if it starts to smell bad or look dirty. Below are steps to follow when changing the drainage bag. °1. Wash your hands with soap and water. °2. Pinch off the rubber catheter so that urine does not spill out. °3. Disconnect the catheter tube from the drainage tube at the connection valve. Do not let the tubes touch any surface. °4. Clean the end of the catheter tube with an alcohol wipe. Use a different alcohol wipe to clean the   end of the drainage tube. °5. Connect the catheter tube to the drainage tube of the clean drainage bag. °6. Attach the new bag to the leg with adhesive tape or a leg strap. Avoid attaching the new bag too tightly. °7. Wash your hands well. °Cleaning the Drainage Bag °1. Wash your hands with soap and water. °2. Wash the bag in warm, soapy water. °3. Rinse the bag thoroughly with warm water. °4. Fill the bag with a solution of white vinegar and water (1 cup vinegar to 1 qt warm water [.2 L vinegar to 1 L warm water]). Close the bag and soak it for 30 minutes in the solution. °5. Rinse the bag with warm water. °6. Hang the bag to dry with the pour spout open  and hanging downward. °7. Store the clean bag (once it is dry) in a clean plastic bag. °8. Wash your hands well. °PREVENTING INFECTION °· Wash your hands before and after handling your catheter. °· Take showers daily and wash the area where the catheter enters your body. Do not take baths. Replace wet leg straps with dry ones, if this applies. °· Do not use powders, sprays, or lotions on the genital area. Only use creams, lotions, or ointments as directed by your caregiver. °· For females, wipe from front to back after each bowel movement. °· Drink enough fluids to keep your urine clear or pale yellow unless you have a fluid restriction. °· Do not let the drainage bag or tubing touch or lie on the floor. °· Wear cotton underwear to absorb moisture and to keep your skin drier. °SEEK MEDICAL CARE IF:  °· Your urine is cloudy or smells unusually bad. °· Your catheter becomes clogged. °· You are not draining urine into the bag or your bladder feels full. °· Your catheter starts to leak. °SEEK IMMEDIATE MEDICAL CARE IF:  °· You have pain, swelling, redness, or pus where the catheter enters the body. °· You have pain in the abdomen, legs, lower back, or bladder. °· You have a fever. °· You see blood fill the catheter, or your urine is pink or red. °· You have nausea, vomiting, or chills. °· Your catheter gets pulled out. °MAKE SURE YOU:  °· Understand these instructions. °· Will watch your condition. °· Will get help right away if you are not doing well or get worse. °Document Released: 02/16/2005 Document Revised: 07/03/2013 Document Reviewed: 02/08/2012 °ExitCare® Patient Information ©2015 [employer]. This information is not intended to replace advice given to you by your health care provider. Make sure you discuss any questions you have with your health care provider. ° °

## 2013-11-05 ENCOUNTER — Emergency Department (HOSPITAL_COMMUNITY)
Admission: EM | Admit: 2013-11-05 | Discharge: 2013-11-05 | Disposition: A | Discharge status: Home / Self Care | Payer: BC Managed Care – PPO | Attending: Emergency Medicine | Admitting: Emergency Medicine

## 2013-11-05 ENCOUNTER — Encounter (HOSPITAL_COMMUNITY): Payer: Self-pay | Admitting: Emergency Medicine

## 2013-11-05 DIAGNOSIS — Z87891 Personal history of nicotine dependence: Secondary | ICD-10-CM | POA: Insufficient documentation

## 2013-11-05 DIAGNOSIS — N390 Urinary tract infection, site not specified: Secondary | ICD-10-CM

## 2013-11-05 DIAGNOSIS — Z8546 Personal history of malignant neoplasm of prostate: Secondary | ICD-10-CM | POA: Insufficient documentation

## 2013-11-05 DIAGNOSIS — Z09 Encounter for follow-up examination after completed treatment for conditions other than malignant neoplasm: Secondary | ICD-10-CM | POA: Insufficient documentation

## 2013-11-05 DIAGNOSIS — Z792 Long term (current) use of antibiotics: Secondary | ICD-10-CM | POA: Insufficient documentation

## 2013-11-05 DIAGNOSIS — Z9889 Other specified postprocedural states: Secondary | ICD-10-CM | POA: Insufficient documentation

## 2013-11-05 DIAGNOSIS — C679 Malignant neoplasm of bladder, unspecified: Secondary | ICD-10-CM | POA: Diagnosis not present

## 2013-11-05 DIAGNOSIS — Z79899 Other long term (current) drug therapy: Secondary | ICD-10-CM | POA: Diagnosis not present

## 2013-11-05 LAB — URINALYSIS, ROUTINE W REFLEX MICROSCOPIC
Bilirubin Urine: NEGATIVE
Glucose, UA: NEGATIVE mg/dL
Ketones, ur: NEGATIVE mg/dL
Nitrite: NEGATIVE
Protein, ur: 100 mg/dL — AB
SPECIFIC GRAVITY, URINE: 1.013 (ref 1.005–1.030)
UROBILINOGEN UA: 0.2 mg/dL (ref 0.0–1.0)
pH: 5 (ref 5.0–8.0)

## 2013-11-05 LAB — URINE MICROSCOPIC-ADD ON

## 2013-11-05 NOTE — Discharge Instructions (Signed)
Bladder Cancer  Bladder cancer is an abnormal growth of tissue in your bladder. Your bladder is the balloon-like sac in your pelvis. It collects and stores urine that comes from the kidneys through the ureters. The bladder wall is made of layers. If cancer spreads into these layers and through the wall of the bladder, it becomes more difficult to treat.   There are four stages of bladder cancer:  · Stage I. Cancer at this stage occurs in the bladder's inner lining but has not invaded the muscular bladder wall.  · Stage II. At this stage, cancer has invaded the bladder wall but is still confined to the bladder.  · Stage III. By this stage, the cancer cells have spread through the bladder wall to surrounding tissue. They may also have spread to the prostate in men or the uterus or vagina in women.  · Stage IV. By this stage, cancer cells may have spread to the lymph nodes and other organs, such as your lungs, bones, or liver.  RISK FACTORS  Although the cause of bladder cancer is not known, the following risk factors can increase your chances of getting bladder cancer:   · Smoking.    · Occupational exposures, such as rubber, leather, textile, dyes, chemicals, and paint.  · Being white.  · Age.    · Being male.    · Having chronic bladder inflammation.    · Having a bladder cancer history.    · Having a family history of bladder cancer (heredity).    · Having had chemotherapy or radiation therapy to the pelvis.    · Being exposed to arsenic.    SYMPTOMS   · Blood in the urine.    · Pain with urination.    · Frequent bladder or urine infections.  · Increase in urgency and frequency of urination.    DIAGNOSIS   Your health care provider may suspect bladder cancer based on your description of urinary symptoms or based on the finding of blood or infection in the urine (especially if this has recurred several times). Other tests or procedures that may be performed include:   · A narrow tube being inserted into your bladder  through your urethra (cystoscopy) in order to view the lining of your bladder for tumors.    · A biopsy to sample the tumor to see if cancer is present.    If cancer is present, it will then be staged to determine its severity and extent. It is important to know how deeply into the bladder wall the cancer has grown and whether the cancer has spread to any other parts of your body. Staging may require blood tests or special scans such as a CT scan, MRI, bone scan, or chest X-ray.   TREATMENT   Once your cancer has been diagnosed and staged, you should discuss a treatment plan with your health care provider. Based on the stage of the cancer, one treatment or a combination of treatments may be recommended. The most common forms of treatment are:   · Surgery. Procedures that may be done include transurethral resection and cystectomy.  · Radiation therapy. This is infrequently used to treat bladder cancer.    · Chemotherapy.  During this treatment, drugs are used to kill cancer cells.  · Immunotherapy. This is usually administered directly into the bladder.  HOME CARE INSTRUCTIONS  · Take medicines only as directed by your health care provider.    · Maintain a healthy diet.    · Consider joining a support group. This may help you learn to cope with the   stress of having bladder cancer.    · Seek advice to help you manage treatment side effects.    · Keep all follow-up visits as directed by your health care provider.    · Inform your cancer specialist if you are admitted to the hospital.    SEEK MEDICAL CARE IF:  · There is blood in your urine.  · You have symptoms of a urinary tract infection. These include:  ¨ Tiredness.  ¨ Shakiness.  ¨ Weakness.  ¨ Muscle aches.  ¨ Abdominal pain.  ¨ Frequent and intense urge to urinate (in young women).  ¨ Burning feeling in the bladder or urethra during urination (in young women).  SEEK IMMEDIATE MEDICAL CARE IF:   You are unable to urinate.  Document Released: 02/19/2003 Document  Revised: 07/03/2013 Document Reviewed: 08/09/2012  ExitCare® Patient Information ©2015 [employer]. This information is not intended to replace advice given to you by your health care provider. Make sure you discuss any questions you have with your health care provider.

## 2013-11-05 NOTE — ED Notes (Signed)
Post void residual scanned 10ml

## 2013-11-05 NOTE — ED Provider Notes (Signed)
CSN: [id number]     Arrival date & time 11/05/13  1801 History   First MD Initiated Contact with Patient 11/05/13 1916   This chart was scribed for non-physician practitioner Daniel Barrack, PA-C, working with Daniel Sorrow, MD by Daniel Peterson, ED scribe. This patient was seen in room TR06C/TR06C and the patient's care was started at 7:45 PM.    Chief Complaint  Patient presents with  . Follow-up   The history is provided by the patient. No language interpreter was used.   HPI Comments:  Daniel Peterson is a 63 y.o. male with a h/o bladder cancer who presents to the Emergency Department complaining of catheter discomfort. Pt had a cystoscopy performed 3 days ago and now has a foley catheter. Pt was told that he would only have to have the foley catheter for 2 days, however the holiday has interfered with the removal schedule. Pt states that the first one that was placed was too small, which was replaced and fits fine, however he is experiencing extreme discomfort and irritation. Pt denies abdominal pain. Pt is taking Levaquin, pro biotic, and magnesium citrate. Per pt Urologist is Daniel Barters, MD at [facility], and pt spoke with Resident on call today, which gave approval for removal of catheter.  Daniel Peterson ID Number: [name]  [name]  Past Medical History  Diagnosis Date  . History of prostate cancer     s/p  radioactive seed implants  in 2013  . Bladder cancer   . Frequency of urination   . Urgency of urination   . Hematuria   . Nocturia    Past Surgical History  Procedure Laterality Date  . Radioactive prostate seed implants  2013  . Transurethral resection of bladder tumor N/A 06/26/2013    Procedure: TRANSURETHRAL RESECTION OF BLADDER TUMOR (TURBT), POSSIBLE INSTILLATION OF MITOMYCIN C;  Surgeon: Daniel Amass, MD;  Location: [facility];  Service: Urology;  Laterality: N/A;  **OUTPATIENT WITH OBSERVATION** (POSSIBLE)    History reviewed. No  pertinent family history. History  Substance Use Topics  . Smoking status: Former Smoker    Types: Cigarettes    Quit date: 06/24/1978  . Smokeless tobacco: Never Used  . Alcohol Use: No    Review of Systems  Genitourinary:       Catheter discomfort  All other systems reviewed and are negative.     Allergies  Review of patient's allergies indicates no known allergies.  Home Medications   Prior to Admission medications   Medication Sig Start Date End Date Taking? Authorizing Provider  levofloxacin (LEVAQUIN) 500 MG tablet Take 500 mg by mouth daily. Take for 4 days. Started on 11-04-13   Yes Historical Provider, MD  Magnesium Citrate 100 MG TABS Take 100 mg by mouth daily.   Yes Historical Provider, MD  oxycodone (OXY-IR) 5 MG capsule Take 5 mg by mouth every 4 (four) hours as needed for pain.   Yes Historical Provider, MD  Probiotic Product (PROBIOTIC DAILY PO) Take 1 tablet by mouth every morning.   Yes Historical Provider, MD  Psyllium (METAMUCIL PO) Take 1 tablet by mouth every evening.   Yes Historical Provider, MD  solifenacin (VESICARE) 5 MG tablet Take 10 mg by mouth 2 (two) times daily as needed (bladder spasms).   Yes Historical Provider, MD   BP 127/92  Pulse 114  Temp(Src) 98.5 F (36.9 C) (Oral)  Resp 16  Ht 6\' 2"  (1.88 m)  Wt 208 lb (94.348 kg)  BMI 26.69 kg/m2  SpO2 96% Physical Exam  Nursing note and vitals reviewed. Constitutional: He is oriented to person, place, and time. He appears well-developed and well-nourished.  HENT:  Head: Normocephalic and atraumatic.  Mouth/Throat: No oropharyngeal exudate.  Eyes: Conjunctivae and EOM are normal. Pupils are equal, round, and reactive to light. No scleral icterus.  Neck: Normal range of motion. Neck supple.  Cardiovascular: Normal rate, regular rhythm, normal heart sounds and intact distal pulses.  Exam reveals no gallop and no friction rub.   No murmur heard. Pulmonary/Chest: Effort normal and breath sounds  normal. No respiratory distress. He has no wheezes. He has no rales. He exhibits no tenderness.  Abdominal: Soft. Bowel sounds are normal. He exhibits no distension and no mass. There is no tenderness. There is no rebound and no guarding.  Genitourinary: Testes normal. Uncircumcised. Penile tenderness present. No phimosis, paraphimosis, hypospadias or penile erythema. No discharge found.  Penis catheterized with folley  Musculoskeletal: Normal range of motion.  Neurological: He is alert and oriented to person, place, and time.  Skin: Skin is warm and dry.  Psychiatric: He has a normal mood and affect. His behavior is normal.    ED Course  Procedures  DIAGNOSTIC STUDIES: Oxygen Saturation is 96% on RA, adequate by my interpretation.  COORDINATION OF CARE: 7:51 PM-Discussed treatment plan which includes consultation with resident for approval for removal of catheter with pt at bedside and pt agreed to plan.  Labs Review Labs Reviewed  URINALYSIS, ROUTINE W REFLEX MICROSCOPIC - Abnormal; Notable for the following:    APPearance CLOUDY (*)    Hgb urine dipstick LARGE (*)    Protein, ur 100 (*)    Leukocytes, UA MODERATE (*)    All other components within normal limits  URINE MICROSCOPIC-ADD ON - Abnormal; Notable for the following:    Squamous Epithelial / LPF FEW (*)    Bacteria, UA MANY (*)    All other components within normal limits    Imaging Review No results found.   EKG Interpretation None      MDM   Final diagnoses:  Malignant neoplasm of urinary bladder, unspecified site  UTI (lower urinary tract infection)    Patient is a 63 y.o. Male with bladder cancer who presents for removal of folley after cystoscopy.  I spoke with on call resident from [facility] practice Dr. Aundria Peterson who gave permission to remove catheter.  Patient was able to voluntarily void here with 125 mL of production.  Bladder scan negative for retention.  UA continues to show moderate leukocytes.   Patient to continue levaquin.  Patient to follow-up with his originally scheduled urology appointment.  Patient to return for urinary retention.  Patient states understanding and agreement.    I personally performed the services described in this documentation, which was scribed in my presence. The recorded information has been reviewed and is accurate.     Cherylann Parr, PA-C 11/06/13 [phone]

## 2013-11-05 NOTE — ED Notes (Signed)
He states he has had bladder cancer and had a foley catheter placed to monitor output after a biopsy procedure on Friday. He reports the catheter is "scratching the wall of my bladder and its really bothering me. i called my urologist and they told me it would be okay to the ED and have it removed."

## 2013-11-09 NOTE — ED Provider Notes (Signed)
Medical screening examination/treatment/procedure(s) were performed by non-physician practitioner and as supervising physician I was immediately available for consultation/collaboration.   EKG Interpretation None        Fredia Sorrow, MD 11/09/13 [phone]

## 2014-02-05 ENCOUNTER — Ambulatory Visit (INDEPENDENT_AMBULATORY_CARE_PROVIDER_SITE_OTHER): Payer: BC Managed Care – PPO | Admitting: Podiatry

## 2014-02-05 ENCOUNTER — Encounter: Payer: Self-pay | Admitting: Podiatry

## 2014-02-05 VITALS — BP 145/83 | HR 60 | Resp 12 | Ht 74.0 in | Wt 205.0 lb

## 2014-02-05 DIAGNOSIS — G609 Hereditary and idiopathic neuropathy, unspecified: Secondary | ICD-10-CM

## 2014-02-05 NOTE — Progress Notes (Signed)
   Subjective:    Patient ID: Daniel Peterson, male    DOB: [birth year], 63 y.o.   MRN: [id number]  HPI Comments: N numbness L B/L forefeet D couple years O worsening after chemo C numbness A hx of chemotherapy T OTC orthotics.  The numbness occurs on and off weightbearing. Upon extreme exertion of soccer he describes some occasional sharp pains in his forefeet which were diagnosis possible neuromas 10+ years ago. He describes some local injection into the forefoot area without any change in symptoms.  History of bladder cancer with surgical treatment and chemotherapy patient states that he is made a complete recovery from bladder cancer.  .Review of Systems  Skin:       Change in nails  Neurological: Positive for numbness.  All other systems reviewed and are negative.      Objective:   Physical Exam  Orientated 3  Vascular: DP pulses 2/4 bilaterally PT pulses 4/4 bilaterally  Neurological: Vibratory sensation nonreactive bilaterally Ankle reflex equal and reactive bilaterally Knee reflexes equal and reactive bilaterally  Dermatological: Texture and turgor within normal limits  Musculoskeletal: Stable gait Mild palpable tenderness second and third intermetatarsal spaces bilaterally without any palpable lesions      Assessment & Plan:   Assessment: Peripheral neuropathy undetermined cause  Plan: Advised patient that symptoms seem to be most consistent with peripheral neuropathy. Advised him to consult with neurologist for definitive diagnosis.

## 2014-02-05 NOTE — Patient Instructions (Signed)
Consult with neurologist for further evaluation of neuropathy in your feet

## 2014-09-06 HISTORY — PX: PROSTATECTOMY: SHX69

## 2014-10-30 DIAGNOSIS — I2510 Atherosclerotic heart disease of native coronary artery without angina pectoris: Secondary | ICD-10-CM

## 2014-10-30 HISTORY — DX: Atherosclerotic heart disease of native coronary artery without angina pectoris: I25.10

## 2014-10-30 HISTORY — PX: CARDIAC CATHETERIZATION: SHX172

## 2014-11-08 ENCOUNTER — Encounter: Payer: Self-pay | Admitting: *Deleted

## 2014-11-08 NOTE — Progress Notes (Signed)
Patient ID: Daniel Peterson, male   DOB: Feb 17, 1951, 64 y.o.   MRN: [id number]     Cardiology Office Note   Date:  11/08/2014   ID:  Daniel Peterson, DOB [birth year], MRN [id number]  PCP:  No PCP Per Patient  Cardiologist:   Jenkins Rouge, MD   No chief complaint on file.     History of Present Illness: [name] is a 64 y.o. male who presents for post hospital f/u.  He suffered and acute anterior wall MI at [facility] on 10/30/14.  No previous CAD.  Reviewed over 50 pages of records from [facility] and Dr Valentino Nose.  These included admission and d/c summaries, viewed discs with angiogram and echo x2.    18 months ago diagnosed with bladder cancer.  Has had a rough course since then.  Retired [employer] planned on moving to [place] with wife.  ? Normal stress testing prior to prostatectomy at [facility] 5 weeks ago. 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  Recurrence needing f/u prostatectomy.  Has severe peripheral neuropathy and depression as a result of Rx and change in retirement plans.    SSCP 8/30 with 5 hr delay to presentation with ST elevation MI    Cath:  Normal RCA/Circumflex.  Proximal LAD occlusion.  Rx with 4.0 mm x 23 mm BM vision stent.  BMS chosen due to anemia and cancer.  Had thrombectomy and EF 25% with impending shock Rx with Impella device overnight.  No CHF or VT.  BP low and d/c with low dose coreg no ACE.  F/U echo with EF 35-40% prior to hospital d/c .  D/C ECG with loss of R waves through precordium.  Labs remarkable for Hct 30, PLT 189 and normal renal function.  CPK rose to over 3000.      Past Medical History  Diagnosis Date  . History of prostate cancer     s/p  radioactive seed implants  in 2013  . Bladder cancer   . Frequency of urination   . Urgency of urination   . Hematuria   . Nocturia   . H/O gastroesophageal reflux (GERD)   . Coronary artery disease     Past Surgical History    Procedure Laterality Date  . Radioactive prostate seed implants  2013  . Transurethral resection of bladder tumor N/A 06/26/2013    Procedure: TRANSURETHRAL RESECTION OF BLADDER TUMOR (TURBT), POSSIBLE INSTILLATION OF MITOMYCIN C;  Surgeon: [name], MD;  Location: [facility];  Service: Urology;  Laterality: N/A;  **OUTPATIENT WITH OBSERVATION** (POSSIBLE)   . Cardiac catheterization  10-30-14    BARE METAL STENT TO LAD     Current Outpatient Prescriptions  Medication Sig Dispense Refill  . levofloxacin (LEVAQUIN) 500 MG tablet Take 500 mg by mouth daily. Take for 4 days. Started on 11-04-13    . Magnesium Citrate 100 MG TABS Take 100 mg by mouth daily.    Marland Kitchen oxycodone (OXY-IR) 5 MG capsule Take 5 mg by mouth every 4 (four) hours as needed for pain.    . Probiotic Product (PROBIOTIC DAILY PO) Take 1 tablet by mouth every morning.    . Psyllium (METAMUCIL PO) Take 1 tablet by mouth every evening.    . solifenacin (VESICARE) 5 MG tablet Take 10 mg by mouth 2 (two) times daily as needed (bladder spasms).     No current facility-administered medications for this visit.  Allergies:   Review of patient's allergies indicates no known allergies.    Social History:  The patient  reports that he quit smoking about 36 years ago. His smoking use included Cigarettes. He has never used smokeless tobacco. He reports that he does not drink alcohol or use illicit drugs.   Family History:  The patient's family history includes Cancer in his father; Melanoma in his mother; Thyroid disease in his sister and sister.    ROS:  Please see the history of present illness.   Otherwise, review of systems are positive for none.   All other systems are reviewed and negative.    PHYSICAL EXAM: VS:  There were no vitals taken for this visit. , BMI There is no weight on file to calculate BMI. Affect appropriate Healthy:  appears stated age 70: normal Neck supple with no adenopathy JVP  normal no bruits no thyromegaly Lungs clear with no wheezing and good diaphragmatic motion Heart:  S1/S2 no murmur, no rub, gallop or click PMI normal Abdomen: benighn, BS positve, no tenderness, no AAA no bruit.  No HSM or HJR Distal pulses intact with no bruits No edema Neuro non-focal Skin warm and dry No muscular weakness    EKG:   10/31/14  Acute anterior ST elevation MI.  D/C ECG 9/2 with anterior MI loss of R waves across precordium no aneurysm or residual ST elevation.  No VT or heart block   Recent Labs: No results found for requested labs within last 365 days.    Lipid Panel No results found for: CHOL, TRIG, HDL, CHOLHDL, VLDL, LDLCALC, LDLDIRECT    Wt Readings from Last 3 Encounters:  02/05/14 92.987 kg (205 lb)  11/05/13 94.348 kg (208 lb)  11/04/13 94.348 kg (208 lb)      Other studies Reviewed: Additional studies/ records that were reviewed today include:  See HPI records from [facility] head including notes, angiogram and echo as well as labs.    ASSESSMENT AND PLAN:  1.  Anterior Wall MI:  8/31 Rx with BMS.  D/C with aspirin and effient Compliant.  No complication of CHF or VT.  EF 35-40% by d/c echo so no life vest indicated.  Impella access sight RFA well healed.  Rx limited by BP and tendency to have postural hypotension from cancer and weight loss.  Try to take coreg 3.125 in am and start altace 2.5 mg at night when he goes to bed.  Best approach to increase mortality and avoid CHF and adverse remodeling to have some blunting of the sympathetic and renin angiotensin system on board.  Refer to cardiac rehab.  Meds refilled.  Will order cardiac MRI first part of December ( 3 months post MI) to further risk stratify for need of AICD.  Discussed diet and risks of restenosis, CHF and sudden death.   2. Prostate Cancer:  F/u oncology.  Ok to resume B vitamins and take low dose steroids one month after MI for neuropathy.   Urostomy working normally.   3. Chol:  On  statin  4. Hypotension:  Chronic over past year since cancer diagnosis and weight loss.  Discussed parameters regarding taking low dose coreg and altace.  Give ACE at night when sleeping to mitigate effects.  Stay hydrated salt ok as no signs of CHF 5. Depression:  Seems better than advertised by wife.  Looking forward to trip to [place] to see sister.  Ok to travel at end of month   Current medicines are  reviewed at length with the patient today.  The patient does not have concerns regarding medicines.  The following changes have been made:  Coreg once daily in am and Altace once daily in pm  Labs/ tests ordered today include: Cardiac Rehab.  Will order cardiac MRI first week in December   No orders of the defined types were placed in this encounter.     Disposition:   FU with me on return from [place] end of October first week in [place], Jenkins Rouge, MD  11/08/2014 5:59 PM    [address], [place], [place]  [address] Phone: [phone]; Fax: [phone]

## 2014-11-09 ENCOUNTER — Encounter: Payer: Self-pay | Admitting: Cardiovascular Disease

## 2014-11-09 ENCOUNTER — Ambulatory Visit (INDEPENDENT_AMBULATORY_CARE_PROVIDER_SITE_OTHER): Payer: BLUE CROSS/BLUE SHIELD | Admitting: Cardiovascular Disease

## 2014-11-09 VITALS — BP 90/60 | Ht 74.0 in | Wt 183.0 lb

## 2014-11-09 DIAGNOSIS — Z136 Encounter for screening for cardiovascular disorders: Secondary | ICD-10-CM

## 2014-11-09 DIAGNOSIS — I2109 ST elevation (STEMI) myocardial infarction involving other coronary artery of anterior wall: Secondary | ICD-10-CM | POA: Diagnosis not present

## 2014-11-09 MED ORDER — RAMIPRIL 2.5 MG PO CAPS: 2.5000 mg | ORAL_CAPSULE | Freq: Every day | ORAL | Status: DC

## 2014-11-09 MED ORDER — CARVEDILOL 3.125 MG PO TABS: 3.1250 mg | ORAL_TABLET | ORAL | Status: DC

## 2014-11-09 MED ORDER — PRASUGREL HCL 10 MG PO TABS: 10.0000 mg | ORAL_TABLET | Freq: Every day | ORAL | Status: DC

## 2014-11-09 MED ORDER — FAMOTIDINE 20 MG PO TABS: 20.0000 mg | ORAL_TABLET | Freq: Two times a day (BID) | ORAL | Status: DC

## 2014-11-09 MED ORDER — ATORVASTATIN CALCIUM 40 MG PO TABS: 40.0000 mg | ORAL_TABLET | Freq: Every day | ORAL | Status: DC

## 2014-11-09 NOTE — Patient Instructions (Addendum)
Your physician recommends that you schedule a follow-up appointment in: October Dr Lovena Le in [place] 2.5 mg daily   You have been referred to cardiac rehab in [place] at [facility]: [facility]onda [facility]   Thank you for choosing [facility] !

## 2014-11-13 ENCOUNTER — Emergency Department (HOSPITAL_COMMUNITY): Payer: BLUE CROSS/BLUE SHIELD

## 2014-11-13 ENCOUNTER — Encounter (HOSPITAL_COMMUNITY): Payer: Self-pay | Admitting: Emergency Medicine

## 2014-11-13 ENCOUNTER — Telehealth: Payer: Self-pay | Admitting: Cardiovascular Disease

## 2014-11-13 ENCOUNTER — Observation Stay (HOSPITAL_COMMUNITY)
Admission: EM | Admit: 2014-11-13 | Discharge: 2014-11-14 | Disposition: A | Discharge status: Home / Self Care | Payer: BLUE CROSS/BLUE SHIELD | Attending: Cardiology | Admitting: Cardiology

## 2014-11-13 DIAGNOSIS — I952 Hypotension due to drugs: Secondary | ICD-10-CM

## 2014-11-13 DIAGNOSIS — R799 Abnormal finding of blood chemistry, unspecified: Secondary | ICD-10-CM

## 2014-11-13 DIAGNOSIS — Z8719 Personal history of other diseases of the digestive system: Secondary | ICD-10-CM | POA: Diagnosis not present

## 2014-11-13 DIAGNOSIS — Z7982 Long term (current) use of aspirin: Secondary | ICD-10-CM | POA: Diagnosis not present

## 2014-11-13 DIAGNOSIS — R42 Dizziness and giddiness: Secondary | ICD-10-CM | POA: Diagnosis not present

## 2014-11-13 DIAGNOSIS — Z9889 Other specified postprocedural states: Secondary | ICD-10-CM | POA: Diagnosis not present

## 2014-11-13 DIAGNOSIS — Z8551 Personal history of malignant neoplasm of bladder: Secondary | ICD-10-CM | POA: Insufficient documentation

## 2014-11-13 DIAGNOSIS — I2510 Atherosclerotic heart disease of native coronary artery without angina pectoris: Secondary | ICD-10-CM | POA: Insufficient documentation

## 2014-11-13 DIAGNOSIS — I252 Old myocardial infarction: Secondary | ICD-10-CM | POA: Diagnosis not present

## 2014-11-13 DIAGNOSIS — Z79899 Other long term (current) drug therapy: Secondary | ICD-10-CM | POA: Insufficient documentation

## 2014-11-13 DIAGNOSIS — Z8546 Personal history of malignant neoplasm of prostate: Secondary | ICD-10-CM | POA: Insufficient documentation

## 2014-11-13 DIAGNOSIS — R778 Other specified abnormalities of plasma proteins: Secondary | ICD-10-CM

## 2014-11-13 DIAGNOSIS — R110 Nausea: Secondary | ICD-10-CM

## 2014-11-13 DIAGNOSIS — Z955 Presence of coronary angioplasty implant and graft: Secondary | ICD-10-CM

## 2014-11-13 DIAGNOSIS — R7989 Other specified abnormal findings of blood chemistry: Secondary | ICD-10-CM

## 2014-11-13 DIAGNOSIS — M359 Systemic involvement of connective tissue, unspecified: Secondary | ICD-10-CM | POA: Insufficient documentation

## 2014-11-13 DIAGNOSIS — I959 Hypotension, unspecified: Secondary | ICD-10-CM | POA: Insufficient documentation

## 2014-11-13 DIAGNOSIS — Z87891 Personal history of nicotine dependence: Secondary | ICD-10-CM | POA: Diagnosis not present

## 2014-11-13 HISTORY — DX: Acute myocardial infarction, unspecified: I21.9

## 2014-11-13 HISTORY — DX: Hypotension, unspecified: I95.9

## 2014-11-13 HISTORY — DX: Other specified disorders involving the immune mechanism, not elsewhere classified: D89.89

## 2014-11-13 LAB — BASIC METABOLIC PANEL
Anion gap: 9 (ref 5–15)
BUN: 17 mg/dL (ref 6–20)
CO2: 24 mmol/L (ref 22–32)
Calcium: 8.7 mg/dL — ABNORMAL LOW (ref 8.9–10.3)
Chloride: 103 mmol/L (ref 101–111)
Creatinine, Ser: 1.3 mg/dL — ABNORMAL HIGH (ref 0.61–1.24)
GFR calc Af Amer: >60 mL/min (ref >60)
GFR calc non Af Amer: 56 mL/min — ABNORMAL LOW (ref >60)
Glucose, Bld: 142 mg/dL — ABNORMAL HIGH (ref 65–99)
Potassium: 3.8 mmol/L (ref 3.5–5.1)
Sodium: 136 mmol/L (ref 135–145)

## 2014-11-13 LAB — CBC WITH DIFFERENTIAL/PLATELET
Basophils Absolute: 0 10*3/uL (ref 0.0–0.1)
Basophils Relative: 0 % (ref 0–1)
Eosinophils Absolute: 0.1 10*3/uL (ref 0.0–0.7)
Eosinophils Relative: 2 % (ref 0–5)
HCT: 29.9 % — ABNORMAL LOW (ref 39.0–52.0)
Hemoglobin: 9.6 g/dL — ABNORMAL LOW (ref 13.0–17.0)
Lymphocytes Relative: 8 % — ABNORMAL LOW (ref 12–46)
Lymphs Abs: 0.7 10*3/uL (ref 0.7–4.0)
MCH: 27 pg (ref 26.0–34.0)
MCHC: 32.1 g/dL (ref 30.0–36.0)
MCV: 84.2 fL (ref 78.0–100.0)
Monocytes Absolute: 0.4 10*3/uL (ref 0.1–1.0)
Monocytes Relative: 5 % (ref 3–12)
Neutro Abs: 6.9 10*3/uL (ref 1.7–7.7)
Neutrophils Relative %: 85 % — ABNORMAL HIGH (ref 43–77)
Platelets: 305 10*3/uL (ref 150–400)
RBC: 3.55 MIL/uL — ABNORMAL LOW (ref 4.22–5.81)
RDW: 13.7 % (ref 11.5–15.5)
WBC: 8.2 10*3/uL (ref 4.0–10.5)

## 2014-11-13 LAB — I-STAT TROPONIN, ED: Troponin i, poc: 0.12 ng/mL (ref 0.00–0.08)

## 2014-11-13 LAB — TROPONIN I: TROPONIN I: 0.09 ng/mL — AB (ref <0.031)

## 2014-11-13 MED ORDER — SENNA 8.6 MG PO TABS
2.0000 | ORAL_TABLET | Freq: Every day | ORAL | Status: DC
  Administered 2014-11-13: 17.2 mg via ORAL
  Filled 2014-11-13: qty 2

## 2014-11-13 MED ORDER — CARVEDILOL 3.125 MG PO TABS
3.1250 mg | ORAL_TABLET | ORAL | Status: DC
  Administered 2014-11-14: 3.125 mg via ORAL
  Filled 2014-11-13: qty 1

## 2014-11-13 MED ORDER — ASPIRIN EC 325 MG PO TBEC: 325.0000 mg | DELAYED_RELEASE_TABLET | Freq: Every day | ORAL | Status: DC

## 2014-11-13 MED ORDER — PRASUGREL HCL 10 MG PO TABS
10.0000 mg | ORAL_TABLET | Freq: Every day | ORAL | Status: DC
  Administered 2014-11-14: 10 mg via ORAL
  Filled 2014-11-13: qty 1

## 2014-11-13 MED ORDER — GABAPENTIN 300 MG PO CAPS
300.0000 mg | ORAL_CAPSULE | Freq: Every day | ORAL | Status: DC
  Administered 2014-11-14: 300 mg via ORAL
  Filled 2014-11-13: qty 1

## 2014-11-13 MED ORDER — DOCUSATE SODIUM 100 MG PO CAPS
100.0000 mg | ORAL_CAPSULE | Freq: Every morning | ORAL | Status: DC
  Administered 2014-11-14: 100 mg via ORAL
  Filled 2014-11-13: qty 1

## 2014-11-13 MED ORDER — ATORVASTATIN CALCIUM 40 MG PO TABS
40.0000 mg | ORAL_TABLET | Freq: Every day | ORAL | Status: DC
  Administered 2014-11-13: 40 mg via ORAL
  Filled 2014-11-13: qty 1

## 2014-11-13 MED ORDER — NITROGLYCERIN 0.4 MG SL SUBL
0.4000 mg | SUBLINGUAL_TABLET | SUBLINGUAL | Status: DC | PRN
Start: 2014-11-13 — End: 2014-11-14

## 2014-11-13 MED ORDER — ASPIRIN EC 81 MG PO TBEC
81.0000 mg | DELAYED_RELEASE_TABLET | Freq: Every day | ORAL | Status: DC
  Administered 2014-11-14: 81 mg via ORAL
  Filled 2014-11-13: qty 1

## 2014-11-13 MED ORDER — GABAPENTIN 300 MG PO CAPS
600.0000 mg | ORAL_CAPSULE | Freq: Two times a day (BID) | ORAL | Status: DC
  Administered 2014-11-13: 600 mg via ORAL
  Filled 2014-11-13: qty 2

## 2014-11-13 MED ORDER — ONDANSETRON HCL 4 MG/2ML IJ SOLN: 4.0000 mg | Freq: Four times a day (QID) | INTRAMUSCULAR | Status: DC | PRN

## 2014-11-13 MED ORDER — ACETAMINOPHEN 325 MG PO TABS: 650.0000 mg | ORAL_TABLET | ORAL | Status: DC | PRN

## 2014-11-13 MED ORDER — GABAPENTIN 300 MG PO CAPS
600.0000 mg | ORAL_CAPSULE | Freq: Once | ORAL | Status: AC
  Administered 2014-11-13: 600 mg via ORAL
  Filled 2014-11-13: qty 2

## 2014-11-13 MED ORDER — FAMOTIDINE 20 MG PO TABS
20.0000 mg | ORAL_TABLET | Freq: Two times a day (BID) | ORAL | Status: DC
  Administered 2014-11-13 – 2014-11-14 (×2): 20 mg via ORAL
  Filled 2014-11-13 (×2): qty 1

## 2014-11-13 NOTE — Telephone Encounter (Signed)
Returned call to patients wife.  Wife stated that the patient vomited last night and has been nauseated since last Friday, 11-09-14.  Saw Dr Johnsie Cancel on Friday, 11-09-14 for post hospital f/u for acute MI on 10-30-14 at [facility].  His Coreg 3.125 was boosted to twice a day from once a day and Ramipril 2.5mg  was added.  Has been nauseated since that time.  The wife's concern was with his lower b/p and increased HR.  His b/p's were 97/60 and 107/60 and HR's 96 and 98. Patient has some dizziness when standing.  Hasn't tried to eat this am, says he's drinking well and having regular BM's.  Not having SOB or CP.  Has an appt with Dr Johnsie Cancel this Thursday 11-15-14.

## 2014-11-13 NOTE — ED Notes (Signed)
Pt here for near syncope and recent medication change with tachycardia and some hypotension more than normal per wife; pt had recent MI and started on new meds recently

## 2014-11-13 NOTE — ED Notes (Signed)
Daniel Peterson- [phone] Wife would like to be notified upon bed assignment

## 2014-11-13 NOTE — H&P (Signed)
CARDIOLOGY CONSULT NOTE   Patient ID: Daniel Peterson MRN: [id number], DOB/AGE: 07/05/1950   Admit date: 11/13/2014 Date of Consult: 11/13/2014 Reason for Consult: Elevated Troponin  Primary Physician: No PCP Per Patient Primary Cardiologist: Dr. Johnsie Cancel  HPI: Daniel Peterson is a 64 y.o. male with PMH of CAD (Anterior MI - 10/30/2014 - BMS to LAD), prostate cancer (s/p prostatectomy 10/2014), and bladder cancer (s/p transurethral resection of tumor 05/2013) who presents to [facility] ED on 11/13/2014 for near syncope, hypotension, and tachycardia.  The patient reports he was seen by Dr. Johnsie Cancel on 11/09/2014 to establish care following his recent MI in [place], [place]. Prior to that visit he had been on ASA 325mg , Lipitor 40mg , Effient 10mg  daily, and Coreg 3.125mg  BID. Dr. Johnsie Cancel decreased the frequency of the patient's Coreg to 3.125mg  in the AM and added Ramipril 2.5mg  daily to take in the evenings. Starting that evening, a few hours after he took Ramipril, he started to experience some nausea and fatigue. This continued into Saturday and Sunday at which times his wife would check his BP and note it was in the 80's - 90's with his HR being in the 80's - 90's. His symptoms continued to progress to where he had a decreased appetite, vomiting, and a near syncopal episode during which he had dizziness.  He has not experienced any chest pain, shortness of breath, or diaphoresis with these episodes. With his recent MI in August, he experienced pain down his left arm, diaphoresis, and back pain which he is without at this time.  His wife has been diligently measuring his BP and HR. The readings she obtained this morning which worried her are listed below. She contacted the office several times and they recommended she bring him to the ED for evaluation:   11/12/2014:                 BP: 107/96  HR: 96 11/13/2014 8:00 AM:   BP: 97/63    HR: 95 11/13/2014 9:00 AM:   BP: 88/57    HR: 88 11/13/2014 11: 00AM: BP:  81/67    HR: 100  He had no previous history of CAD prior to his recent MI in August. Per Dr. Kyla Balzarine office note his hospital records were significant for: Cath: Normal RCA/Circumflex. Proximal LAD occlusion. Rx with 4.0 mm x 23 mm BM vision stent. BMS chosen due to anemia and cancer. Had thrombectomy and EF 25% with impending shock Rx with Impella device overnight. No CHF or VT. BP low and d/c with low dose coreg no ACE. F/U echo with EF 35-40% prior to hospital d/c . D/C ECG with loss of R waves through precordium.  He reports a history of hypotension ever since his prostatectomy and subsequent GI surgeries this past July and August. He was noted to have orthostatic hypotension during his hospitalizations as well. With there addition of medications following his recent MI, he had been tolerating them well until Ramipril was added on 11/09/2014. According to the patient and his wife, he was not having these symptoms even on Coreg 3.125mg  BID.  While in the ED, his BP has been 99/60 - 124/75 with HR in the 70's - 80's. He reports his nausea and lightheadedness has resolved. POC-troponin was found to be elevated at 0.12. Creatinine elevated to 1.30. CBC showing anemia with Hgb of 9.6. CXR showing no acute cardiopulmonary disease.    Problem List: Past Medical History  Diagnosis Date  . History of  prostate cancer     s/p  radioactive seed implants  in 2013  . Bladder cancer   . Frequency of urination   . Urgency of urination   . Hematuria   . Nocturia   . H/O gastroesophageal reflux (GERD)   . Coronary artery disease 10/30/2014    BMS to LAD   . Hypotension   . Autoimmune disorder     Being followed by Neurology    Past Surgical History  Procedure Laterality Date  . Radioactive prostate seed implants  2013  . Transurethral resection of bladder tumor N/A 06/26/2013    Procedure: TRANSURETHRAL RESECTION OF BLADDER TUMOR (TURBT), POSSIBLE INSTILLATION OF MITOMYCIN C;  Surgeon:  [name], MD;  Location: [facility];  Service: Urology;  Laterality: N/A;  **OUTPATIENT WITH OBSERVATION** (POSSIBLE)   . Cardiac catheterization  10/30/14    BARE METAL STENT TO LAD  . Prostatectomy  09/06/2014     Allergies: No Known Allergies    Inpatient Medications    Family History: Family History  Problem Relation Age of Onset  . Melanoma Mother   . Cancer Father   . Thyroid disease Sister   . Thyroid disease Sister   . Coronary artery disease Father      Social History: Social History   Social History  . Marital Status: Married    Spouse Name: N/A  . Number of Children: N/A  . Years of Education: N/A   Occupational History  . Not on file.   Social History Main Topics  . Smoking status: Former Smoker    Types: Cigarettes    Quit date: 06/24/1978  . Smokeless tobacco: Never Used  . Alcohol Use: No  . Drug Use: No  . Sexual Activity: Not on file   Other Topics Concern  . Not on file   Social History Narrative     Review of Systems: General:  No chills, fever, night sweats or weight changes. Positive for fatigue.  Cardiovascular:  No chest pain, dyspnea on exertion, edema, orthopnea, palpitations, paroxysmal nocturnal dyspnea. Dermatological: No rash, lesions/masses Respiratory: Positive for cough, negative for  dyspnea Urologic: No hematuria, dysuria Abdominal:   Positive for nausea and vomiting, negative for  diarrhea, bright red blood per rectum, melena, or hematemesis Neurologic:  No visual changes, wkns, changes in mental status. All other systems reviewed and are otherwise negative except as noted above.  Physical Exam  Blood pressure 115/71, pulse 81, temperature 98.1 F (36.7 C), temperature source Oral, resp. rate 15, SpO2 100 %.  General: Pleasant, Caucasian male in no acute distress. Psych: Normal affect. Neuro: Alert and oriented X 3. Moves all extremities spontaneously. HEENT: Normal  Neck: Supple without  bruits or JVD. Lungs:  Resp regular and unlabored, CTA without wheezing or rales. Heart: RRR no s3, s4, or murmurs. Abdomen: Soft, non-tender, non-distended, BS + x 4.  Extremities: No clubbing, cyanosis or edema. DP/PT/Radials 2+ and equal bilaterally.  Labs  No results for input(s): CKTOTAL, CKMB, TROPONINI in the last 72 hours. Lab Results  Component Value Date   WBC 8.2 11/13/2014   HGB 9.6* 11/13/2014   HCT 29.9* 11/13/2014   MCV 84.2 11/13/2014   PLT 305 11/13/2014     Recent Labs Lab 11/13/14 1245  NA 136  K 3.8  CL 103  CO2 24  BUN 17  CREATININE 1.30*  CALCIUM 8.7*  GLUCOSE 142*  Radiology/Studies  Dg Chest 2 View: 11/13/2014   CLINICAL DATA:  Near  syncopal episode earlier today. Intermittent nausea and dizziness for the past 2 weeks. MI with coronary stent placement 10/30/2014. Patient currently undergoing radiation therapy for bladder cancer. Former smoker.  EXAM: CHEST  2 VIEW  COMPARISON:  None.  FINDINGS: Cardiomediastinal silhouette unremarkable. Lungs clear. Bronchovascular markings normal. Pulmonary vascularity normal. No visible pleural effusions. No pneumothorax. Mild degenerative changes involving the thoracic spine and slight thoracic scoliosis convex right.  IMPRESSION: No acute cardiopulmonary disease.   Electronically Signed   By: Evangeline Dakin M.D.   On: 11/13/2014 13:49    ECG: Normal Sinus rhythm. T-wave inversion leads V2, V3, V4.  QTc is prolonged since previous EKG on 11/09/2014. TWI present on previous EKG's from 10/31/2014.  ASSESSMENT AND PLAN  1. Hypotension in setting of new medication - patient had anterior MI on 10/30/2014 with BMS to LAD placed at that time and was started on ASA 325mg , Lipitor 40mg , Effient 10mg  daily, and Coreg 3.125mg  BID.  - Saw Dr. Johnsie Cancel in the office on 11/09/2014 and Coreg was decreased to 3.125mg  in the AM and Ramipril 2.5mg  daily to take in the evenings was added. Patient's symptoms of nausea, vomiting, fatigue,  lightheadedness, and hypotension have been present since. - Will discontinue Ramipril at this time. Continue with Coreg 3.125mg  in AM.  2. Elevated Troponin - history of CAD with recent BMS to LAD on 10/30/2014. - POC-troponin elevated to 0.12. - Admit to telemetry for observation.  - Will cycle troponin values and obtain additional EKG. - continue ASA, Lipitor, Effient, and Coreg.  3. HLD - continue statin therapy  4. Prostate Cancer - s/p prostatectomy 10/2014  5. Bladder Cancer - s/p transurethral resection of tumor 05/2013  6. History of Anemia - continue to monitor  - Hgb 9.6 on 11/13/2014.  Signed, Erma Heritage, PA-C 11/13/2014, 5:26 PM Pager: [phone]   History and all data above reviewed.  Patient examined.  I agree with the findings as above.  The patient had a recent large anterior MI.  He has had low BPs.  He recently had Altace low dose added to his PM meds.  His PM dose of beta blocker was discontinued.  He has been weak with lightheadedness over the weekend.  No chest pain.  No acute SOB.   The patient exam reveals COR:RRR  ,  Lungs: Clear  ,  Abd: Positive bowel sounds, no rebound no guarding, Ext No edema  .  All available labs, radiology testing, previous records reviewed. Agree with documented assessment and plan.  Observer overnight.  I will stop the Altace (We might be able to restart half the dose when he is seen back in the clinic on Thursday.)  I suspect that the EKG and tropononin abnormality are normalizing from his recent event.  Check another EKG and enzyme in the AM.  Follow BPs overnight.  Probable discharge in the AM.    Minus Breeding  6:19 PM  11/13/2014

## 2014-11-13 NOTE — ED Provider Notes (Signed)
CSN: [id number]     Arrival date & time 11/13/14  1212 History   First MD Initiated Contact with Patient 11/13/14 1312     Chief Complaint  Patient presents with  . Hypotension     (Consider location/radiation/quality/duration/timing/severity/associated sxs/prior Treatment) HPI   64 year old male with nausea and orthostatic symptoms. Patient recently with an anterior wall myocardial infarction 8/30 treated at [facility]. Stent to the LAD. Local cardiology care was established this past Friday with Dr. Johnsie Cancel. He was started on ramipril at that time. Patient states that since about Friday he has had persistent nausea and lightheadedness. Lightheadedness is worse with positional changes. Wife reports has had blood pressures to 80/60 at times with HR around 100. Denies any chest pain. Symptoms with his MI included back pain, nausea and diaphoresis. Current symptoms do not feel similar to the ones he was having then.   Past Medical History  Diagnosis Date  . History of prostate cancer     s/p  radioactive seed implants  in 2013  . Bladder cancer   . Frequency of urination   . Urgency of urination   . Hematuria   . Nocturia   . H/O gastroesophageal reflux (GERD)   . Coronary artery disease    Past Surgical History  Procedure Laterality Date  . Radioactive prostate seed implants  2013  . Transurethral resection of bladder tumor N/A 06/26/2013    Procedure: TRANSURETHRAL RESECTION OF BLADDER TUMOR (TURBT), POSSIBLE INSTILLATION OF MITOMYCIN C;  Surgeon: [name], MD;  Location: [facility];  Service: Urology;  Laterality: N/A;  **OUTPATIENT WITH OBSERVATION** (POSSIBLE)   . Cardiac catheterization  10-30-14    BARE METAL STENT TO LAD   Family History  Problem Relation Age of Onset  . Melanoma Mother   . Cancer Father   . Thyroid disease Sister   . Thyroid disease Sister    Social History  Substance Use Topics  . Smoking status: Former Smoker    Types:  Cigarettes    Quit date: 06/24/1978  . Smokeless tobacco: Never Used  . Alcohol Use: No    Review of Systems  All systems reviewed and negative, other than as noted in HPI.   Allergies  Review of patient's allergies indicates no known allergies.  Home Medications   Prior to Admission medications   Medication Sig Start Date End Date Taking? Authorizing Provider  aspirin 325 MG EC tablet Take 325 mg by mouth daily.    Historical Provider, MD  atorvastatin (LIPITOR) 40 MG tablet Take 1 tablet (40 mg total) by mouth at bedtime. 11/09/14   Josue Hector, MD  carvedilol (COREG) 3.125 MG tablet Take 1 tablet (3.125 mg total) by mouth every morning. 11/09/14   Josue Hector, MD  famotidine (PEPCID) 20 MG tablet Take 1 tablet (20 mg total) by mouth 2 (two) times daily. 11/09/14   Josue Hector, MD  gabapentin (NEURONTIN) 300 MG capsule Take 300 mg by mouth 3 (three) times daily.    Historical Provider, MD  prasugrel (EFFIENT) 10 MG TABS tablet Take 1 tablet (10 mg total) by mouth daily. 11/09/14   Josue Hector, MD  ramipril (ALTACE) 2.5 MG capsule Take 1 capsule (2.5 mg total) by mouth daily. 11/09/14   Josue Hector, MD  Sennosides ([employer] SENNA-EXTRA) 17.2 MG TABS Take by mouth.    Historical Provider, MD   BP 103/71 mmHg  Pulse 85  Temp(Src) 98.1 F (36.7 C) (Oral)  Resp 16  SpO2 100% Physical Exam  Constitutional: He appears well-developed and well-nourished. No distress.  HENT:  Head: Normocephalic and atraumatic.  Eyes: Conjunctivae are normal. Right eye exhibits no discharge. Left eye exhibits no discharge.  Neck: Neck supple.  Cardiovascular: Normal rate, regular rhythm and normal heart sounds.  Exam reveals no gallop and no friction rub.   No murmur heard. Pulmonary/Chest: Effort normal and breath sounds normal. No respiratory distress.  Abdominal: Soft. There is no tenderness.  Musculoskeletal: He exhibits no edema or tenderness.  Lower extremities symmetric as compared to each  other. No calf tenderness. Negative Homan's. No palpable cords.   Neurological: He is alert.  Skin: Skin is warm and dry.  Psychiatric: He has a normal mood and affect. His behavior is normal. Thought content normal.  Nursing note and vitals reviewed.   ED Course  Procedures (including critical care time) Labs Review Labs Reviewed  BASIC METABOLIC PANEL - Abnormal; Notable for the following:    Glucose, Bld 142 (*)    Creatinine, Ser 1.30 (*)    Calcium 8.7 (*)    GFR calc non Af Amer 56 (*)    All other components within normal limits  CBC WITH DIFFERENTIAL/PLATELET - Abnormal; Notable for the following:    RBC 3.55 (*)    Hemoglobin 9.6 (*)    HCT 29.9 (*)    Neutrophils Relative % 85 (*)    Lymphocytes Relative 8 (*)    All other components within normal limits  I-STAT TROPOININ, ED - Abnormal; Notable for the following:    Troponin i, poc 0.12 (*)    All other components within normal limits    Imaging Review Dg Chest 2 View  11/13/2014   CLINICAL DATA:  Near syncopal episode earlier today. Intermittent nausea and dizziness for the past 2 weeks. MI with coronary stent placement 10/30/2014. Patient currently undergoing radiation therapy for bladder cancer. Former smoker.  EXAM: CHEST  2 VIEW  COMPARISON:  None.  FINDINGS: Cardiomediastinal silhouette unremarkable. Lungs clear. Bronchovascular markings normal. Pulmonary vascularity normal. No visible pleural effusions. No pneumothorax. Mild degenerative changes involving the thoracic spine and slight thoracic scoliosis convex right.  IMPRESSION: No acute cardiopulmonary disease.   Electronically Signed   By: Evangeline Dakin M.D.   On: 11/13/2014 13:49   I have personally reviewed and evaluated these images and lab results as part of my medical decision-making.   EKG Interpretation   Date/Time:  Tuesday November 13 2014 12:18:32 EDT Ventricular Rate:  95 PR Interval:  160 QRS Duration: 74 QT Interval:  376 QTC  Calculation: 472 R Axis:   94 Text Interpretation:  Normal sinus rhythm Rightward axis Anteroseptal  infarct , age undetermined TWI v2-4 No old tracing to compare Confirmed by  Fleet Higham  MD, Alicia ([id number]) on 11/13/2014 1:13:24 PM      MDM   Final diagnoses:  Nausea  Lightheadedness    64 year old male with nausea and orthostatic symptoms. Suspect may be related to recent medication changes. Recent anterior wall MI. Currently denying any chest pain or back pain as he was having with the MI. EKG with T-wave inversions in the anterior precordial leads. Comparison EKG from 9/9 with no data in v3, but taking this into account looks somewhat similar.  Discussed with cards for evaluation.   Virgel Manifold, MD 11/13/14 [phone]

## 2014-11-13 NOTE — Telephone Encounter (Signed)
Was talking to Daniel Dopp PA/flex about the case when patient's wife called back and stated that his b/p was lower and his HR was now higher(see note) and that he is feeling dizziness.   Daniel Peterson advised the patient to go to ED.   Called patient's wife back and I advised that the Daniel Peterson had said that the patient should go to the ED for evaluation. Wife very anxious, but expressed understanding.

## 2014-11-13 NOTE — Telephone Encounter (Signed)
Pt c/o medication issue: 1. Name of Medication: Ramipril (is the medication that was just added but wife is not sure if this is the issue because all of the medications are fairly new.  2. How are you currently taking this medication (dosage and times per day)?  3. Are you having a reaction (difficulty breathing--STAT)? General lethargy. Very sleepy and tired with cold hands.  4. What is your medication issue?   Comments: High Pulse and a low BP.  This morning the BP  Was 97/60, pulse 96. Last night the pulse was 98 and 107//60 BP. Pt wife reports that the pt is nauseated and vomiting. Not able to keep food down. Just had a heart attack and the pt isn't adjusting well. Made appt to see Dr. Johnsie Cancel   11/15/2014 @ 8:45a.

## 2014-11-13 NOTE — Telephone Encounter (Signed)
Follow up     81/61 bp and pulse 100 this am.  Pt almost fainted.  Wife is very anxious and want to talk to nurse again.  Pt had heart attack recently.  Wife want to talk to someone.

## 2014-11-13 NOTE — Telephone Encounter (Signed)
Follow up   Pt wife calling to let Dr. Johnsie Cancel know that pt is in the ER at [facility]

## 2014-11-14 ENCOUNTER — Telehealth (HOSPITAL_COMMUNITY): Payer: Self-pay | Admitting: Cardiac Rehabilitation

## 2014-11-14 DIAGNOSIS — Z955 Presence of coronary angioplasty implant and graft: Secondary | ICD-10-CM | POA: Diagnosis not present

## 2014-11-14 DIAGNOSIS — M359 Systemic involvement of connective tissue, unspecified: Secondary | ICD-10-CM | POA: Diagnosis not present

## 2014-11-14 DIAGNOSIS — R7989 Other specified abnormal findings of blood chemistry: Secondary | ICD-10-CM | POA: Diagnosis not present

## 2014-11-14 DIAGNOSIS — I950 Idiopathic hypotension: Secondary | ICD-10-CM | POA: Diagnosis not present

## 2014-11-14 DIAGNOSIS — R42 Dizziness and giddiness: Secondary | ICD-10-CM | POA: Diagnosis not present

## 2014-11-14 DIAGNOSIS — I2510 Atherosclerotic heart disease of native coronary artery without angina pectoris: Secondary | ICD-10-CM | POA: Diagnosis not present

## 2014-11-14 DIAGNOSIS — I959 Hypotension, unspecified: Secondary | ICD-10-CM | POA: Diagnosis not present

## 2014-11-14 LAB — BASIC METABOLIC PANEL
Anion gap: 9 (ref 5–15)
BUN: 13 mg/dL (ref 6–20)
CALCIUM: 8.7 mg/dL — AB (ref 8.9–10.3)
CO2: 22 mmol/L (ref 22–32)
CREATININE: 1.2 mg/dL (ref 0.61–1.24)
Chloride: 106 mmol/L (ref 101–111)
GFR calc non Af Amer: >60 mL/min (ref >60)
Glucose, Bld: 105 mg/dL — ABNORMAL HIGH (ref 65–99)
Potassium: 3.8 mmol/L (ref 3.5–5.1)
Sodium: 137 mmol/L (ref 135–145)

## 2014-11-14 LAB — CBC
HCT: 26.8 % — ABNORMAL LOW (ref 39.0–52.0)
Hemoglobin: 9 g/dL — ABNORMAL LOW (ref 13.0–17.0)
MCH: 28 pg (ref 26.0–34.0)
MCHC: 33.6 g/dL (ref 30.0–36.0)
MCV: 83.2 fL (ref 78.0–100.0)
PLATELETS: 262 10*3/uL (ref 150–400)
RBC: 3.22 MIL/uL — AB (ref 4.22–5.81)
RDW: 13.7 % (ref 11.5–15.5)
WBC: 6.6 10*3/uL (ref 4.0–10.5)

## 2014-11-14 LAB — TROPONIN I
TROPONIN I: 0.1 ng/mL — AB (ref <0.031)
Troponin I: 0.09 ng/mL — ABNORMAL HIGH (ref <0.031)

## 2014-11-14 MED ORDER — ASPIRIN EC 81 MG PO TBEC: 81.0000 mg | DELAYED_RELEASE_TABLET | Freq: Every day | ORAL | Status: DC

## 2014-11-14 MED ORDER — PANTOPRAZOLE SODIUM 40 MG PO TBEC: 40.0000 mg | DELAYED_RELEASE_TABLET | Freq: Every day | ORAL | Status: DC

## 2014-11-14 MED ORDER — NITROGLYCERIN 0.4 MG SL SUBL: 0.4000 mg | SUBLINGUAL_TABLET | SUBLINGUAL | Status: DC | PRN

## 2014-11-14 NOTE — Telephone Encounter (Signed)
pc to pt to discuss enrolling in cardiac rehab.  Awaiting MD order.  Pt is anxious to begin cardiac rehab to learn how to follow low fat diet and regain weight lost from cancer treatments.  Pt is also anxious to learn exercise guidelines before going to [place] in 2 weeks.  Pt wife advised will schedule rehab when pt returns from [place].  Pt wife given home exercise instructions of 30 minutes light intensity walking  5-7 days per week.  Pt wife also instructed to follow moderate well balanced diet to include adding pleasurable calories.  Pt does enjoy salami sandwiches and frozen yogurt.  Pt wife advised ok to eat these in moderation.  Understanding verbalized.

## 2014-11-14 NOTE — Discharge Summary (Signed)
Discharge Summary   Patient ID: Daniel Peterson,  MRN: [id number], DOB/AGE: 07-Apr-1950 64 y.o.  Admit date: 11/13/2014 Discharge date: 11/14/2014  Primary Care Provider: No PCP Per Patient Primary Cardiologist: Dr. Johnsie Cancel  Discharge Diagnoses Active Problems:   Hypotension   History of coronary artery stent placement   Elevated troponin   Allergies No Known Allergies  Procedures: None  History of Present Illness  Daniel Peterson is a 64 y.o. male with PMH of CAD (Anterior MI - 10/30/2014 - BMS to LAD), prostate cancer (s/p prostatectomy 10/2014), and bladder cancer (s/p transurethral resection of tumor 05/2013) who presents to [facility] ED on 11/13/2014 for near syncope, hypotension, and tachycardia.  The patient reported he was seen by Dr. Johnsie Cancel on 11/09/2014 to establish care following his recent MI in [place], [place]. Prior to that visit he had been on ASA 325mg , Lipitor 40mg , Effient 10mg  daily, and Coreg 3.125mg  BID. Dr. Johnsie Cancel decreased the frequency of the patient's Coreg to 3.125mg  in the AM and added Ramipril 2.5mg  daily to take in the evenings. Starting that evening, a few hours after he took Ramipril, he started to experience some nausea and fatigue. This continued into Saturday and Sunday at which times his wife would check his BP and note it was in the 80's - 90's with his HR being in the 80's - 90's. His symptoms continued to progress to where he had a decreased appetite, vomiting, and a near syncopal episode during which he had dizziness.  He has not experienced any chest pain, shortness of breath, or diaphoresis with these episodes. With his recent MI in August, he experienced pain down his left arm, diaphoresis, and back pain which he is without at this time.  His wife has been diligently measuring his BP and HR. The readings she obtained this morning which worried her are listed below. She contacted the office several times and they recommended she bring him to the ED for  evaluation:  11/12/2014: BP: 107/96 HR: 96 11/13/2014 8:00 AM: BP: 97/63 HR: 95 11/13/2014 9:00 AM: BP: 88/57 HR: 88 11/13/2014 11: 00AM: BP: 81/67 HR: 100  He had no previous history of CAD prior to his recent MI in August. Per Dr. Kyla Balzarine office note his hospital records were significant for: Cath: Normal RCA/Circumflex. Proximal LAD occlusion. Rx with 4.0 mm x 23 mm BM vision stent. BMS chosen due to anemia and cancer. Had thrombectomy and EF 25% with impending shock Rx with Impella device overnight. No CHF or VT. BP low and d/c with low dose coreg no ACE. F/U echo with EF 35-40% prior to hospital d/c . D/C ECG with loss of R waves through precordium.  He reports a history of hypotension ever since his prostatectomy and subsequent GI surgeries this past July and August. He was noted to have orthostatic hypotension during his hospitalizations as well. With there addition of medications following his recent MI, he had been tolerating them well until Ramipril was added on 11/09/2014. According to the patient and his wife, he was not having these symptoms even on Coreg 3.125mg  BID.  While in the ED, his BP has been 99/60 - 124/75 with HR in the 70's - 80's. He reports his nausea and lightheadedness has resolved. POC-troponin was found to be elevated at 0.12. Creatinine elevated to 1.30. CBC showing anemia with Hgb of 9.6. CXR showing no acute cardiopulmonary disease.  Hospital Course  He was admitted for observation. It was felt that EKG and tropononin abnormality  were normalizing from his recent event. His ASA reduced to 81mg  as this might cause GI upset. Held Coreg and Ramipril. No chest pain, sob or palpitations. No further dizziness or near syncope episode. Troponin trend was flat 0.12->0.19->0.10->0.09. Repeat EKG without any changes.   He has been seen by Dr. Johnsie Cancel  today and deemed ready for discharge home. All follow-up appointments have been scheduled.  Discharge medications are listed below. Added protonix not clear that he doesn't have some gastritis. Continue Coreg. Hold Ramipril for now, add as outpatient. Suspected won't be able to add ACE until weight goes above 190lb.  Discharge Vitals Blood pressure 100/61, pulse 87, temperature 98.7 F (37.1 C), temperature source Oral, resp. rate 16, height 6\' 2"  (1.88 m), weight 178 lb 8 oz (80.967 kg), SpO2 98 %.  Filed Weights   11/13/14 1943 11/14/14 0520  Weight: 178 lb 14.4 oz (81.149 kg) 178 lb 8 oz (80.967 kg)    Labs  CBC  Recent Labs  11/13/14 1245 11/14/14 0710  WBC 8.2 6.6  NEUTROABS 6.9  --   HGB 9.6* 9.0*  HCT 29.9* 26.8*  MCV 84.2 83.2  PLT 305 119   Basic Metabolic Panel  Recent Labs  11/13/14 1245 11/14/14 0710  NA 136 137  K 3.8 3.8  CL 103 106  CO2 24 22  GLUCOSE 142* 105*  BUN 17 13  CREATININE 1.30* 1.20  CALCIUM 8.7* 8.7*   Cardiac Enzymes  Recent Labs  11/13/14 2130 11/14/14 0113 11/14/14 0710  TROPONINI 0.09* 0.10* 0.09*    Disposition  Pt is being discharged home today in good condition.  Follow-up Plans & Appointments  Follow-up Information    Follow up with Truitt Merle, NP. Go on 12/12/2014.   Specialties:  Nurse Practitioner, Interventional Cardiology, Cardiology, Radiology   Why:  @9 :30 for cardiology follow up   Contact information:   [place]. [address] [place] [address] [phone]           Discharge Instructions    Call MD for:  extreme fatigue    Complete by:  As directed      Call MD for:  persistant dizziness or light-headedness    Complete by:  As directed      Diet - low sodium heart healthy    Complete by:  As directed      Increase activity slowly    Complete by:  As directed            F/u Labs/Studies: None  Discharge Medications    Medication List    STOP taking these medications        famotidine 20 MG tablet  Commonly known as:  PEPCID     ramipril 2.5 MG capsule    Commonly known as:  ALTACE      TAKE these medications        aspirin EC 81 MG tablet  Take 1 tablet (81 mg total) by mouth daily.     atorvastatin 40 MG tablet  Commonly known as:  LIPITOR  Take 1 tablet (40 mg total) by mouth at bedtime.     carvedilol 3.125 MG tablet  Commonly known as:  COREG  Take 1 tablet (3.125 mg total) by mouth every morning.     [employer] SENNA-EXTRA 17.2 MG Tabs  Generic drug:  Sennosides  Take 17.2 mg by mouth every evening.     docusate sodium 100 MG capsule  Commonly known as:  COLACE  Take  100 mg by mouth every morning.     gabapentin 300 MG capsule  Commonly known as:  NEURONTIN  Take 300-600 mg by mouth 3 (three) times daily. 300mg  in the morning (0800), 600 in the afternoon (1430), 600 in the evening (2200)     nitroGLYCERIN 0.4 MG SL tablet  Commonly known as:  NITROSTAT  Place 1 tablet (0.4 mg total) under the tongue every 5 (five) minutes x 3 doses as needed for chest pain.     pantoprazole 40 MG tablet  Commonly known as:  PROTONIX  Take 1 tablet (40 mg total) by mouth daily.     prasugrel 10 MG Tabs tablet  Commonly known as:  EFFIENT  Take 1 tablet (10 mg total) by mouth daily.     VITAMIN B-12 PO  Take 1 tablet by mouth every morning.        Duration of Discharge Encounter   Greater than 30 minutes including physician time.  Signed, Katty Fretwell PA-C 11/14/2014, 8:37 AM

## 2014-11-14 NOTE — Progress Notes (Signed)
Patient ID: Daniel Peterson, male   DOB: [birth year], 64 y.o.   MRN: [id number] Patient well known to me Anterior MI 8/30 at [facility]. 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   Suspect won't be able to add ACE until weight goes above 190   [name]

## 2014-11-14 NOTE — Progress Notes (Signed)
Patient Name: Daniel Peterson Date of Encounter: 11/14/2014   SUBJECTIVE  Feeling well. No chest pain, sob or palpitations. No further dizziness or near syncope episode.   CURRENT MEDS . aspirin EC  81 mg Oral Daily  . atorvastatin  40 mg Oral QHS  . carvedilol  3.125 mg Oral [id number]  . docusate sodium  100 mg Oral q morning - 10a  . famotidine  20 mg Oral BID  . gabapentin  300 mg Oral Daily  . gabapentin  600 mg Oral BID  . prasugrel  10 mg Oral Daily  . senna  2 tablet Oral QHS    OBJECTIVE  Filed Vitals:   11/13/14 1700 11/13/14 1715 11/13/14 1943 11/14/14 0520  BP: 115/71 111/67 131/83 100/61  Pulse: 81 83 87 87  Temp:   98.4 F (36.9 C) 98.7 F (37.1 C)  TempSrc:   Oral Oral  Resp: 15 17 20 16   Height:   6\' 2"  (1.88 m)   Weight:   178 lb 14.4 oz (81.149 kg) 178 lb 8 oz (80.967 kg)  SpO2: 100% 100% 100% 98%    Intake/Output Summary (Last 24 hours) at 11/14/14 0801 Last data filed at 11/14/14 0520  Gross per 24 hour  Intake      0 ml  Output   1000 ml  Net  -1000 ml   Filed Weights   11/13/14 1943 11/14/14 0520  Weight: 178 lb 14.4 oz (81.149 kg) 178 lb 8 oz (80.967 kg)    PHYSICAL EXAM  General: Pleasant, NAD. Neuro: Alert and oriented X 3. Moves all extremities spontaneously. Psych: Normal affect. HEENT:  Normal  Neck: Supple without bruits or JVD. Lungs:  Resp regular and unlabored, CTA. Heart: RRR no s3, s4, or murmurs. Abdomen: Soft, non-tender, non-distended, BS + x 4.  Extremities: No clubbing, cyanosis or edema. DP/PT/Radials 2+ and equal bilaterally.  Accessory Clinical Findings  CBC  Recent Labs  11/13/14 1245 11/14/14 0710  WBC 8.2 6.6  NEUTROABS 6.9  --   HGB 9.6* 9.0*  HCT 29.9* 26.8*  MCV 84.2 83.2  PLT 305 962   Basic Metabolic Panel  Recent Labs  11/13/14 1245  NA 136  K 3.8  CL 103  CO2 24  GLUCOSE 142*  BUN 17  CREATININE 1.30*  CALCIUM 8.7*   Liver Function Tests No results for input(s): AST, ALT, ALKPHOS,  BILITOT, PROT, ALBUMIN in the last 72 hours. No results for input(s): LIPASE, AMYLASE in the last 72 hours. Cardiac Enzymes  Recent Labs  11/13/14 2130 11/14/14 0113  TROPONINI 0.09* 0.10*    TELE  NSR  Radiology/Studies  Dg Chest 2 View  11/13/2014   CLINICAL DATA:  Near syncopal episode earlier today. Intermittent nausea and dizziness for the past 2 weeks. MI with coronary stent placement 10/30/2014. Patient currently undergoing radiation therapy for bladder cancer. Former smoker.  EXAM: CHEST  2 VIEW  COMPARISON:  None.  FINDINGS: Cardiomediastinal silhouette unremarkable. Lungs clear. Bronchovascular markings normal. Pulmonary vascularity normal. No visible pleural effusions. No pneumothorax. Mild degenerative changes involving the thoracic spine and slight thoracic scoliosis convex right.  IMPRESSION: No acute cardiopulmonary disease.   Electronically Signed   By: Evangeline Dakin M.D.   On: 11/13/2014 13:49    ASSESSMENT AND PLAN 1. Hypotension in setting of new medication - patient had anterior MI on 10/30/2014 with BMS to LAD placed at that time and was started on ASA 325mg , Lipitor 40mg , Effient 10mg  daily, and Coreg  3.125mg  BID.  - Saw Dr. Johnsie Cancel in the office on 11/09/2014 and Coreg was decreased to 3.125mg  in the AM and Ramipril 2.5mg  daily to take in the evenings was added.  - BP improved. No further  nausea, vomiting and lightheadedness. - Hold  Ramipril at this time. Continue with Coreg 3.125mg .   2. Elevated Troponin - History of CAD with recent BMS to LAD on 10/30/2014. - POC-troponin midly elevated, flat trend.  - continue ASA 81 , Lipitor, Effient, and Coreg.  3. HLD - continue statin therapy  4. Prostate Cancer - s/p prostatectomy 10/2014  5. Bladder Cancer - s/p transurethral resection of tumor 05/2013  6. History of Anemia - continue to monitor  - Hgb 9.6 on 11/13/2014.  Dispo: f/u with Dr. Johnsie Cancel in 4 weeks. Start protonix. Continue coreg. Hold  Ramipril for now, add as outpatient.   Jarrett Soho PA-C Pager [phone]

## 2014-11-15 ENCOUNTER — Telehealth: Payer: Self-pay | Admitting: Cardiology

## 2014-11-15 ENCOUNTER — Ambulatory Visit: Payer: BLUE CROSS/BLUE SHIELD | Admitting: Cardiovascular Disease

## 2014-11-15 NOTE — Telephone Encounter (Signed)
Accidentally opened.

## 2014-11-15 NOTE — Telephone Encounter (Signed)
TCM call after dc from hospital yesterday.  Patients wife states he is actually a patient of Dr. Johnsie Cancel not Hochrein  Patient still having nausea even after taken off of Ramipril.  Vomited shortly after eating yesterday.   Was told at discharge by B. Bhagat to keep his appointment with Dr. Johnsie Cancel for tomorrow (today 9/15) when she arrived the appointment had been cancelled   Patient's wife is upset because she was told to follow up the scheduled appointment.  He was admitted over night from ED to inpatient on 9/13.    On 9/14 832am  Vin PA requested appointment for 9/15 to be canceled. T [employer] entered into computer.  No call to patient or wife confirming cancellation.  With patient still having nausea and vomiting even after Ramipril being discontinued, the appointment being cancelled by hospital PA on 9/13 without notification of patient or wife, patient and wife showing up for appointment at [facility] as directed by DC PA at the hospital and told that it had been cancelled I scheduled follow up with Tenny Craw PA for 0900 9/16.  Patients wife would prefer he be seen by a physician, Dr. Johnsie Cancel who is her MD.    Over 3 years since he has seen a PCP  His cancer care and surgery has been done through his physicians at [place] [facility]

## 2014-11-15 NOTE — Telephone Encounter (Signed)
D/C phone call .Marland Kitchen Appt is 12/12/14 at 9:30w/ Truitt Merle @[facility]  [employer]

## 2014-11-15 NOTE — Telephone Encounter (Signed)
LMTCB to let wife know that I have an appointment for husband on 9/16 0900 with Tenny Craw, [address]

## 2014-11-16 ENCOUNTER — Ambulatory Visit: Payer: BLUE CROSS/BLUE SHIELD | Admitting: Physician Assistant

## 2014-12-03 ENCOUNTER — Telehealth: Payer: Self-pay | Admitting: Cardiovascular Disease

## 2014-12-03 NOTE — Telephone Encounter (Signed)
New message      Request for surgical clearance:  What type of surgery is being performed? ERCP 1. When is this surgery scheduled? 01-29-15  Are there any medications that need to be held prior to surgery and how long? Stop anti platelet therapy----(blood thinner) 2. Name of physician performing surgery? Dr Brandon Melnick  3. What is your office phone and fax number? Fax [phone]

## 2014-12-03 NOTE — Telephone Encounter (Signed)
Notified Heather at GI that per Dr. Johnsie Cancel could not hold effient due to large anterior MI w/stent on 10/30/14.  She states she will notify her physicians.

## 2014-12-03 NOTE — Telephone Encounter (Signed)
Spoke with Nira Conn at [facility].  Pt is being scheduled for ERCP and sphincterotomy on 01/29/15 due to biliary dilation and pancreatitis. OK to stay on ASA but GI is requesting to hold Effient for 5-7 days prior to procedure.  May also need to hold for 2 days after procedure.  Fax number below confirmed.

## 2014-12-03 NOTE — Telephone Encounter (Signed)
Cannot hold his effient he had large anterior MI with stenting 10/30/14

## 2014-12-12 ENCOUNTER — Ambulatory Visit: Payer: BLUE CROSS/BLUE SHIELD | Admitting: Nurse Practitioner

## 2015-01-02 ENCOUNTER — Ambulatory Visit: Payer: BLUE CROSS/BLUE SHIELD | Admitting: Cardiovascular Disease

## 2015-01-10 ENCOUNTER — Ambulatory Visit: Payer: BLUE CROSS/BLUE SHIELD | Admitting: Cardiovascular Disease

## 2015-01-18 ENCOUNTER — Inpatient Hospital Stay (HOSPITAL_COMMUNITY)
Admission: EM | Admit: 2015-01-18 | Discharge: 2015-01-20 | DRG: 389 | Payer: BLUE CROSS/BLUE SHIELD | Attending: Internal Medicine | Admitting: Internal Medicine

## 2015-01-18 ENCOUNTER — Emergency Department (HOSPITAL_COMMUNITY): Payer: BLUE CROSS/BLUE SHIELD

## 2015-01-18 ENCOUNTER — Encounter (HOSPITAL_COMMUNITY): Payer: Self-pay | Admitting: Nurse Practitioner

## 2015-01-18 DIAGNOSIS — R0602 Shortness of breath: Secondary | ICD-10-CM

## 2015-01-18 DIAGNOSIS — Z7982 Long term (current) use of aspirin: Secondary | ICD-10-CM

## 2015-01-18 DIAGNOSIS — K5669 Other intestinal obstruction: Secondary | ICD-10-CM | POA: Diagnosis not present

## 2015-01-18 DIAGNOSIS — Z8249 Family history of ischemic heart disease and other diseases of the circulatory system: Secondary | ICD-10-CM

## 2015-01-18 DIAGNOSIS — K5660 Unspecified intestinal obstruction: Secondary | ICD-10-CM | POA: Diagnosis not present

## 2015-01-18 DIAGNOSIS — I252 Old myocardial infarction: Secondary | ICD-10-CM

## 2015-01-18 DIAGNOSIS — Z8349 Family history of other endocrine, nutritional and metabolic diseases: Secondary | ICD-10-CM

## 2015-01-18 DIAGNOSIS — Z936 Other artificial openings of urinary tract status: Secondary | ICD-10-CM

## 2015-01-18 DIAGNOSIS — Z8551 Personal history of malignant neoplasm of bladder: Secondary | ICD-10-CM

## 2015-01-18 DIAGNOSIS — K56609 Unspecified intestinal obstruction, unspecified as to partial versus complete obstruction: Secondary | ICD-10-CM

## 2015-01-18 DIAGNOSIS — Z9079 Acquired absence of other genital organ(s): Secondary | ICD-10-CM

## 2015-01-18 DIAGNOSIS — D649 Anemia, unspecified: Secondary | ICD-10-CM | POA: Diagnosis present

## 2015-01-18 DIAGNOSIS — Z87891 Personal history of nicotine dependence: Secondary | ICD-10-CM

## 2015-01-18 DIAGNOSIS — K219 Gastro-esophageal reflux disease without esophagitis: Secondary | ICD-10-CM | POA: Diagnosis present

## 2015-01-18 DIAGNOSIS — E785 Hyperlipidemia, unspecified: Secondary | ICD-10-CM | POA: Diagnosis present

## 2015-01-18 DIAGNOSIS — Z8546 Personal history of malignant neoplasm of prostate: Secondary | ICD-10-CM

## 2015-01-18 DIAGNOSIS — I2510 Atherosclerotic heart disease of native coronary artery without angina pectoris: Secondary | ICD-10-CM | POA: Diagnosis present

## 2015-01-18 DIAGNOSIS — C679 Malignant neoplasm of bladder, unspecified: Secondary | ICD-10-CM | POA: Diagnosis present

## 2015-01-18 DIAGNOSIS — N179 Acute kidney failure, unspecified: Secondary | ICD-10-CM | POA: Diagnosis present

## 2015-01-18 DIAGNOSIS — C7911 Secondary malignant neoplasm of bladder: Secondary | ICD-10-CM | POA: Diagnosis present

## 2015-01-18 DIAGNOSIS — N135 Crossing vessel and stricture of ureter without hydronephrosis: Secondary | ICD-10-CM | POA: Diagnosis present

## 2015-01-18 DIAGNOSIS — I5022 Chronic systolic (congestive) heart failure: Secondary | ICD-10-CM | POA: Diagnosis present

## 2015-01-18 DIAGNOSIS — Z809 Family history of malignant neoplasm, unspecified: Secondary | ICD-10-CM

## 2015-01-18 HISTORY — DX: Acute pancreatitis without necrosis or infection, unspecified: K85.90

## 2015-01-18 HISTORY — DX: Disorder of kidney and ureter, unspecified: N28.9

## 2015-01-18 LAB — COMPREHENSIVE METABOLIC PANEL
ALBUMIN: 3.7 g/dL (ref 3.5–5.0)
ALK PHOS: 72 U/L (ref 38–126)
ALT: 13 U/L — ABNORMAL LOW (ref 17–63)
ANION GAP: 10 (ref 5–15)
AST: 22 U/L (ref 15–41)
BILIRUBIN TOTAL: 0.9 mg/dL (ref 0.3–1.2)
BUN: 17 mg/dL (ref 6–20)
CALCIUM: 9.6 mg/dL (ref 8.9–10.3)
CO2: 25 mmol/L (ref 22–32)
Chloride: 105 mmol/L (ref 101–111)
Creatinine, Ser: 1.53 mg/dL — ABNORMAL HIGH (ref 0.61–1.24)
GFR calc Af Amer: 54 mL/min — ABNORMAL LOW (ref >60)
GFR, EST NON AFRICAN AMERICAN: 46 mL/min — AB (ref >60)
GLUCOSE: 154 mg/dL — AB (ref 65–99)
Potassium: 3.8 mmol/L (ref 3.5–5.1)
Sodium: 140 mmol/L (ref 135–145)
TOTAL PROTEIN: 7.1 g/dL (ref 6.5–8.1)

## 2015-01-18 LAB — CBC WITH DIFFERENTIAL/PLATELET
Basophils Absolute: 0 10*3/uL (ref 0.0–0.1)
Basophils Relative: 0 %
Eosinophils Absolute: 0.2 10*3/uL (ref 0.0–0.7)
Eosinophils Relative: 3 %
HCT: 34.9 % — ABNORMAL LOW (ref 39.0–52.0)
Hemoglobin: 11 g/dL — ABNORMAL LOW (ref 13.0–17.0)
Lymphocytes Relative: 6 %
Lymphs Abs: 0.4 10*3/uL — ABNORMAL LOW (ref 0.7–4.0)
MCH: 26.4 pg (ref 26.0–34.0)
MCHC: 31.5 g/dL (ref 30.0–36.0)
MCV: 83.7 fL (ref 78.0–100.0)
Monocytes Absolute: 0.5 10*3/uL (ref 0.1–1.0)
Monocytes Relative: 6 %
Neutro Abs: 6.7 10*3/uL (ref 1.7–7.7)
Neutrophils Relative %: 85 %
Platelets: 267 10*3/uL (ref 150–400)
RBC: 4.17 MIL/uL — ABNORMAL LOW (ref 4.22–5.81)
RDW: 17.9 % — ABNORMAL HIGH (ref 11.5–15.5)
WBC: 7.9 10*3/uL (ref 4.0–10.5)

## 2015-01-18 LAB — LIPASE, BLOOD: LIPASE: 51 U/L (ref 11–51)

## 2015-01-18 LAB — I-STAT CG4 LACTIC ACID, ED: LACTIC ACID, VENOUS: 1.81 mmol/L (ref 0.5–2.0)

## 2015-01-18 MED ORDER — SODIUM CHLORIDE 0.9 % IV SOLN
1000.0000 mL | INTRAVENOUS | Status: DC
  Administered 2015-01-18 – 2015-01-19 (×2): 1000 mL via INTRAVENOUS

## 2015-01-18 MED ORDER — HYDROMORPHONE HCL 1 MG/ML IJ SOLN
1.0000 mg | Freq: Once | INTRAMUSCULAR | Status: AC
  Administered 2015-01-18: 1 mg via INTRAVENOUS
  Filled 2015-01-18: qty 1

## 2015-01-18 MED ORDER — SODIUM CHLORIDE 0.9 % IV SOLN
1000.0000 mL | Freq: Once | INTRAVENOUS | Status: AC
  Administered 2015-01-18: 1000 mL via INTRAVENOUS

## 2015-01-18 MED ORDER — IOHEXOL 300 MG/ML  SOLN
80.0000 mL | Freq: Once | INTRAMUSCULAR | Status: AC | PRN
  Administered 2015-01-18: 80 mL via INTRAVENOUS

## 2015-01-18 MED ORDER — IOHEXOL 300 MG/ML  SOLN
25.0000 mL | Freq: Once | INTRAMUSCULAR | Status: AC | PRN
  Administered 2015-01-18: 25 mL via ORAL

## 2015-01-18 MED ORDER — ONDANSETRON HCL 4 MG/2ML IJ SOLN
4.0000 mg | Freq: Once | INTRAMUSCULAR | Status: AC
  Administered 2015-01-18: 4 mg via INTRAVENOUS
  Filled 2015-01-18: qty 2

## 2015-01-18 NOTE — ED Notes (Signed)
Transfer center for [employer] - [id number]

## 2015-01-18 NOTE — ED Notes (Signed)
CT notified pt finished with contrast. 

## 2015-01-18 NOTE — ED Notes (Signed)
Spoke with [facility], no bed available at this time

## 2015-01-18 NOTE — ED Notes (Signed)
Pt reports he woke with sharp pain in abd at 0900 today. Denies n/v/fevers. abd tender on palpation. hes also had some bilateral flank pain. He has an ileostomy and nephrostomy and is currenlty on immunotherapy for bladder ca

## 2015-01-18 NOTE — ED Provider Notes (Signed)
CSN: [id number]     Arrival date & time 01/18/15  1110 History   First MD Initiated Contact with Patient 01/18/15 1126     Chief Complaint  Patient presents with  . Abdominal Pain      HPI Patient awoke this morning and had severe abdominal discomfort which began around 8:30 or 9:00.  He has associated nausea as well as vomiting.  He denies fevers and chills.  He reports ongoing diffuse lower abdominal tenderness.  He has a history of ongoing metastatic bladder cancer and is currently receiving immunotherapy at [facility] and is followed closely by the urology team there.  He has a urostomy in his right lower quadrant and several weeks ago while visiting family in [place] he developed severe urosepsis and was found to have an obstructing ureter resulting in nephrostomy tube placement.  No prior history of small bowel obstruction.  He reports normal output from both his nephrostomy as well as his urostomy.  He's never had discomfort or pain like this before.  He denies chest pain shortness of breath.  His pain is moderate to severe in severity.  He denies back pain.   Past Medical History  Diagnosis Date  . History of prostate cancer     s/p  radioactive seed implants  in 2013  . Bladder cancer ([place])   . Frequency of urination   . Urgency of urination   . Hematuria   . Nocturia   . H/O gastroesophageal reflux (GERD)   . Coronary artery disease 10/30/2014    BMS to LAD   . Hypotension   . Autoimmune disorder [facility])     Being followed by Neurology  . Myocardial infarction ([place])   . Pancreatitis   . Renal insufficiency    Past Surgical History  Procedure Laterality Date  . Radioactive prostate seed implants  2013  . Transurethral resection of bladder tumor N/A 06/26/2013    Procedure: TRANSURETHRAL RESECTION OF BLADDER TUMOR (TURBT), POSSIBLE INSTILLATION OF MITOMYCIN C;  Surgeon: [name], MD;  Location: [facility];  Service: Urology;  Laterality: N/A;   **OUTPATIENT WITH OBSERVATION** (POSSIBLE)   . Cardiac catheterization  10/30/14    BARE METAL STENT TO LAD  . Prostatectomy  09/06/2014  . Ileostomy    . Nephrostomy    . Abdominal surgery     Family History  Problem Relation Age of Onset  . Melanoma Mother   . Cancer Father   . Thyroid disease Sister   . Thyroid disease Sister   . Coronary artery disease Father    Social History  Substance Use Topics  . Smoking status: Former Smoker    Types: Cigarettes    Quit date: 06/24/1978  . Smokeless tobacco: Never Used  . Alcohol Use: No    Review of Systems  All other systems reviewed and are negative.     Allergies  Review of patient's allergies indicates no known allergies.  Home Medications   Prior to Admission medications   Medication Sig Start Date End Date Taking? Authorizing Provider  aspirin EC 81 MG tablet Take 1 tablet (81 mg total) by mouth daily. 11/14/14  Yes Bhavinkumar Bhagat, PA  atezolizumab 1,200 mg in sodium chloride 0.9 % 250 mL Inject into the vein. Last infusion was 01-04-15. Next treatment is pending. 01/04/15  Yes Historical Provider, MD  carvedilol (COREG) 3.125 MG tablet Take 1 tablet (3.125 mg total) by mouth every morning. 11/09/14  Yes Josue Hector,  MD  Cyanocobalamin (VITAMIN B-12 PO) Take 1 tablet by mouth every morning.   Yes Historical Provider, MD  docusate sodium (COLACE) 100 MG capsule Take 100 mg by mouth every morning.   Yes Historical Provider, MD  gabapentin (NEURONTIN) 300 MG capsule Take 300-600 mg by mouth 3 (three) times daily. 300mg  in the morning (0800), 600 in the afternoon (1430), 600 in the evening (2200)   Yes Historical Provider, MD  pantoprazole (PROTONIX) 40 MG tablet Take 1 tablet (40 mg total) by mouth daily. 11/14/14  Yes Bhavinkumar Bhagat, PA  prasugrel (EFFIENT) 10 MG TABS tablet Take 1 tablet (10 mg total) by mouth daily. 11/09/14  Yes Josue Hector, MD  Sennosides ([employer] SENNA-EXTRA) 17.2 MG TABS Take 17.2 mg by mouth every  evening.    Yes Historical Provider, MD  atorvastatin (LIPITOR) 40 MG tablet Take 1 tablet (40 mg total) by mouth at bedtime. 11/09/14   Josue Hector, MD  nitroGLYCERIN (NITROSTAT) 0.4 MG SL tablet Place 1 tablet (0.4 mg total) under the tongue every 5 (five) minutes x 3 doses as needed for chest pain. 11/14/14   Bhavinkumar Bhagat, PA   BP 117/98 mmHg  Pulse 92  Temp(Src) 98.1 F (36.7 C) (Oral)  Resp 19  SpO2 100% Physical Exam  Constitutional: He is oriented to person, place, and time. He appears well-developed and well-nourished.  HENT:  Head: Normocephalic and atraumatic.  Eyes: EOM are normal.  Neck: Normal range of motion.  Cardiovascular: Normal rate, regular rhythm, normal heart sounds and intact distal pulses.   Pulmonary/Chest: Effort normal and breath sounds normal. No respiratory distress.  Abdominal: Soft. He exhibits no distension. There is tenderness.  Urostomy right lower quadrant  Genitourinary:  Nephrostomy left flank without surrounding signs of infection  Musculoskeletal: Normal range of motion.  Neurological: He is alert and oriented to person, place, and time.  Skin: Skin is warm and dry.  Psychiatric: He has a normal mood and affect. Judgment normal.  Nursing note and vitals reviewed.   ED Course  Procedures (including critical care time) Labs Review Labs Reviewed  CBC WITH DIFFERENTIAL/PLATELET - Abnormal; Notable for the following:    RBC 4.17 (*)    Hemoglobin 11.0 (*)    HCT 34.9 (*)    RDW 17.9 (*)    Lymphs Abs 0.4 (*)    All other components within normal limits  COMPREHENSIVE METABOLIC PANEL - Abnormal; Notable for the following:    Glucose, Bld 154 (*)    Creatinine, Ser 1.53 (*)    ALT 13 (*)    GFR calc non Af Amer 46 (*)    GFR calc Af Amer 54 (*)    All other components within normal limits  LIPASE, BLOOD  I-STAT CG4 LACTIC ACID, ED    Imaging Review Ct Abdomen Pelvis W Contrast  01/18/2015  CLINICAL DATA:  Sudden onset of  diffuse abdominal pain, history metastatic bladder cancer, prostate cancer, coronary artery disease post MI, LEFT nephrostomy tube, ileostomy EXAM: CT ABDOMEN AND PELVIS WITH CONTRAST TECHNIQUE: Multidetector CT imaging of the abdomen and pelvis was performed using the standard protocol following bolus administration of intravenous contrast. Sagittal and coronal MPR images reconstructed from axial data set. CONTRAST:  69mL OMNIPAQUE IOHEXOL 300 MG/ML SOLN IV. Dilute oral contrast. COMPARISON:  None FINDINGS: Small RIGHT pleural effusion. Multiple enlarged lymph nodes identified including: RIGHT hilar versus mediastinal nodal mass 6.2 x 5.2 cm. Inferior mediastinal para-aortic node 2.1 cm short axis image 15  Retrocrural node 2.0 cm short axis image 22 Celiac axis node 1.5 cm short axis image 27 LEFT para-aortic node 1.6 cm short axis image 57 Additionally, multiple retroperitoneal soft tissue nodules including: Medial to spleen LEFT subdiaphragmatic 2.4 x 2.2 cm image 25 Perisplenic 5.5 x 4.1 cm image 31 LEFT posterior para renal invading abdominal wall adjacent to nephrostomy tube 3.1 x 2.7 cm LEFT para-aortic mass versus node at the aortic bifurcation 4.7 cm short axis image 63. RIGHT upper quadrant soft tissue mass anterior to liver, question arising from abdominal wall or peritoneal surface 5.1 x 4.3 cm image 22. Perisplenic metastasis 2.3 x 2.3 cm image 31. CBD stent and LEFT nephrostomy tube. RIGHT lower quadrant ostomy, question urostomy. Question mass at pancreatic head question metastasis 1.5 x 1.9 cm image 39. Probable obstruction of the LEFT ureter by an above retroperitoneal mass, decompressed by nephrostomy tube. Liver, gallbladder, spleen, pancreas, kidneys, and adrenal glands otherwise normal. Stomach and proximal small bowel loops distended with decompressed distal small bowel consistent with small bowel obstruction, question transition zone in the anterior mid pelvis. Normal appendix. Colon  decompressed. No free air or free fluid. No osseous metastatic lesions. Probable coronary arterial stent. IMPRESSION: Scattered adenopathy and soft tissue masses as described above compatible with widespread metastatic disease. Obstruction of mid LEFT ureter by tumor with LEFT nephrostomy tube decompressing LEFT renal collecting system. Small bowel obstruction question transition in mid pelvis. RIGHT pleural effusion. Electronically Signed   By: Lavonia Dana M.D.   On: 01/18/2015 13:47   I have personally reviewed and evaluated these images and lab results as part of my medical decision-making.   EKG Interpretation None      MDM   Final diagnoses:  Small bowel obstruction [facility])    Patient with small bowel obstruction in the emergency department.  He feels much better after NG tube placed which drained 500 cc of stomach contents almost immediately.  Repeat abdominal exam after NG tube placement is soft.  He has no peritoneal signs.  His white blood cell count is normal.  Initial plan was for admission at [facility] with hospitalist admission and general surgery consultation.  At this time given his complex surgical and past medical history the surgical service would prefer that he be transferred to [facility] for care closer to those familiar with him.  I spoke with Dr. Otho Ket of urology as well as Dr. Ricky Ala of urology his primary urologist at [facility] who would be happy to accept the patient in transfer to their service for small bowel obstruction.  He is feeling better after NG tube placement.  He has no peritoneal signs at this time.  Patient should be stable for transfer as his vital signs are normal this time.  Patient and family been updated.    Jola Schmidt, MD 01/18/15 [phone]

## 2015-01-19 ENCOUNTER — Encounter (HOSPITAL_COMMUNITY): Payer: Self-pay | Admitting: Internal Medicine

## 2015-01-19 ENCOUNTER — Observation Stay (HOSPITAL_COMMUNITY): Payer: BLUE CROSS/BLUE SHIELD

## 2015-01-19 DIAGNOSIS — I2510 Atherosclerotic heart disease of native coronary artery without angina pectoris: Secondary | ICD-10-CM | POA: Diagnosis present

## 2015-01-19 DIAGNOSIS — K56609 Unspecified intestinal obstruction, unspecified as to partial versus complete obstruction: Secondary | ICD-10-CM | POA: Diagnosis present

## 2015-01-19 DIAGNOSIS — D649 Anemia, unspecified: Secondary | ICD-10-CM | POA: Diagnosis present

## 2015-01-19 DIAGNOSIS — E785 Hyperlipidemia, unspecified: Secondary | ICD-10-CM | POA: Diagnosis present

## 2015-01-19 DIAGNOSIS — Z8546 Personal history of malignant neoplasm of prostate: Secondary | ICD-10-CM | POA: Diagnosis not present

## 2015-01-19 DIAGNOSIS — Z809 Family history of malignant neoplasm, unspecified: Secondary | ICD-10-CM | POA: Diagnosis not present

## 2015-01-19 DIAGNOSIS — I252 Old myocardial infarction: Secondary | ICD-10-CM | POA: Diagnosis not present

## 2015-01-19 DIAGNOSIS — N179 Acute kidney failure, unspecified: Secondary | ICD-10-CM | POA: Diagnosis present

## 2015-01-19 DIAGNOSIS — Z936 Other artificial openings of urinary tract status: Secondary | ICD-10-CM | POA: Diagnosis not present

## 2015-01-19 DIAGNOSIS — Z8349 Family history of other endocrine, nutritional and metabolic diseases: Secondary | ICD-10-CM | POA: Diagnosis not present

## 2015-01-19 DIAGNOSIS — C679 Malignant neoplasm of bladder, unspecified: Secondary | ICD-10-CM | POA: Diagnosis not present

## 2015-01-19 DIAGNOSIS — Z87891 Personal history of nicotine dependence: Secondary | ICD-10-CM | POA: Diagnosis not present

## 2015-01-19 DIAGNOSIS — I5022 Chronic systolic (congestive) heart failure: Secondary | ICD-10-CM | POA: Diagnosis not present

## 2015-01-19 DIAGNOSIS — D6489 Other specified anemias: Secondary | ICD-10-CM | POA: Diagnosis not present

## 2015-01-19 DIAGNOSIS — K5669 Other intestinal obstruction: Secondary | ICD-10-CM | POA: Diagnosis present

## 2015-01-19 DIAGNOSIS — Z7982 Long term (current) use of aspirin: Secondary | ICD-10-CM | POA: Diagnosis not present

## 2015-01-19 DIAGNOSIS — Z8249 Family history of ischemic heart disease and other diseases of the circulatory system: Secondary | ICD-10-CM | POA: Diagnosis not present

## 2015-01-19 DIAGNOSIS — Z9079 Acquired absence of other genital organ(s): Secondary | ICD-10-CM | POA: Diagnosis not present

## 2015-01-19 DIAGNOSIS — Z8551 Personal history of malignant neoplasm of bladder: Secondary | ICD-10-CM | POA: Diagnosis not present

## 2015-01-19 DIAGNOSIS — N135 Crossing vessel and stricture of ureter without hydronephrosis: Secondary | ICD-10-CM | POA: Diagnosis not present

## 2015-01-19 DIAGNOSIS — K5660 Unspecified intestinal obstruction: Secondary | ICD-10-CM | POA: Diagnosis not present

## 2015-01-19 DIAGNOSIS — C7911 Secondary malignant neoplasm of bladder: Secondary | ICD-10-CM | POA: Diagnosis not present

## 2015-01-19 DIAGNOSIS — K219 Gastro-esophageal reflux disease without esophagitis: Secondary | ICD-10-CM | POA: Diagnosis not present

## 2015-01-19 LAB — COMPREHENSIVE METABOLIC PANEL
ALT: 13 U/L — ABNORMAL LOW (ref 17–63)
ANION GAP: 12 (ref 5–15)
AST: 20 U/L (ref 15–41)
Albumin: 3.1 g/dL — ABNORMAL LOW (ref 3.5–5.0)
Alkaline Phosphatase: 63 U/L (ref 38–126)
BUN: 21 mg/dL — ABNORMAL HIGH (ref 6–20)
CHLORIDE: 108 mmol/L (ref 101–111)
CO2: 23 mmol/L (ref 22–32)
CREATININE: 1.22 mg/dL (ref 0.61–1.24)
Calcium: 9.1 mg/dL (ref 8.9–10.3)
Glucose, Bld: 120 mg/dL — ABNORMAL HIGH (ref 65–99)
Potassium: 4.5 mmol/L (ref 3.5–5.1)
SODIUM: 143 mmol/L (ref 135–145)
Total Bilirubin: 0.9 mg/dL (ref 0.3–1.2)
Total Protein: 6.3 g/dL — ABNORMAL LOW (ref 6.5–8.1)

## 2015-01-19 LAB — CBC WITH DIFFERENTIAL/PLATELET
BASOS PCT: 0 %
Basophils Absolute: 0 10*3/uL (ref 0.0–0.1)
EOS ABS: 0 10*3/uL (ref 0.0–0.7)
Eosinophils Relative: 0 %
HEMATOCRIT: 33 % — AB (ref 39.0–52.0)
HEMOGLOBIN: 10.3 g/dL — AB (ref 13.0–17.0)
LYMPHS ABS: 0.3 10*3/uL — AB (ref 0.7–4.0)
Lymphocytes Relative: 4 %
MCH: 25.9 pg — ABNORMAL LOW (ref 26.0–34.0)
MCHC: 31.2 g/dL (ref 30.0–36.0)
MCV: 82.9 fL (ref 78.0–100.0)
MONOS PCT: 10 %
Monocytes Absolute: 0.8 10*3/uL (ref 0.1–1.0)
NEUTROS ABS: 6.8 10*3/uL (ref 1.7–7.7)
NEUTROS PCT: 86 %
Platelets: 283 10*3/uL (ref 150–400)
RBC: 3.98 MIL/uL — AB (ref 4.22–5.81)
RDW: 18.2 % — ABNORMAL HIGH (ref 11.5–15.5)
WBC: 7.9 10*3/uL (ref 4.0–10.5)

## 2015-01-19 LAB — MAGNESIUM: MAGNESIUM: 1.8 mg/dL (ref 1.7–2.4)

## 2015-01-19 LAB — PHOSPHORUS: PHOSPHORUS: 4.7 mg/dL — AB (ref 2.5–4.6)

## 2015-01-19 MED ORDER — POTASSIUM CHLORIDE IN NACL 40-0.9 MEQ/L-% IV SOLN
INTRAVENOUS | Status: DC
  Administered 2015-01-19: 125 mL/h via INTRAVENOUS
  Filled 2015-01-19 (×6): qty 1000

## 2015-01-19 MED ORDER — SODIUM CHLORIDE 0.9 % IJ SOLN
3.0000 mL | Freq: Two times a day (BID) | INTRAMUSCULAR | Status: DC
  Administered 2015-01-19 – 2015-01-20 (×4): 3 mL via INTRAVENOUS

## 2015-01-19 MED ORDER — ASPIRIN 300 MG RE SUPP
300.0000 mg | Freq: Every day | RECTAL | Status: DC
Start: 2015-01-19 — End: 2015-01-20
  Administered 2015-01-19 – 2015-01-20 (×2): 300 mg via RECTAL
  Filled 2015-01-19 (×2): qty 1

## 2015-01-19 MED ORDER — ENOXAPARIN SODIUM 40 MG/0.4ML ~~LOC~~ SOLN
40.0000 mg | SUBCUTANEOUS | Status: DC
  Administered 2015-01-19 – 2015-01-20 (×2): 40 mg via SUBCUTANEOUS
  Filled 2015-01-19 (×2): qty 0.4

## 2015-01-19 MED ORDER — HYDROMORPHONE HCL 1 MG/ML IJ SOLN
1.0000 mg | INTRAMUSCULAR | Status: DC | PRN
  Administered 2015-01-19: 1 mg via INTRAVENOUS
  Filled 2015-01-19: qty 1

## 2015-01-19 MED ORDER — ONDANSETRON HCL 4 MG/2ML IJ SOLN
4.0000 mg | Freq: Four times a day (QID) | INTRAMUSCULAR | Status: DC | PRN
  Administered 2015-01-19 – 2015-01-20 (×4): 4 mg via INTRAVENOUS
  Filled 2015-01-19 (×4): qty 2

## 2015-01-19 MED ORDER — SODIUM CHLORIDE 0.9 % IV SOLN: 1000.0000 mL | INTRAVENOUS | Status: DC

## 2015-01-19 NOTE — Progress Notes (Signed)
Pt medicated for pain this evening

## 2015-01-19 NOTE — Progress Notes (Signed)
4mg  iv zofran administered for c/o nausea. Pt not actively vomitting at this time

## 2015-01-19 NOTE — Progress Notes (Signed)
Bed available at [facility]. Notified pt of bed availability but he declined transfer stating 'there is no need for that'. [place] and communicated this new info

## 2015-01-19 NOTE — H&P (Signed)
[facility] History and Physical  Daniel Peterson [id number] DOB: [birth year] DOA: 01/18/2015  Referring physician: Jola Schmidt, M.D. PCP: No PCP Per Patient   Chief Complaint: Abdominal pain  HPI: Daniel Peterson is a 64 y.o. male with a past medical history prostate cancer, status post prostatectomy, metastatic  bladder cancer on immunotherapy, GERD, CAD status post MI in September, status post urostomy on the right  and left nephrostomy last month due to severe pyelonephritis secondary to ureteral obstruction due to metastasis. He comes to the emergency department with complaints of abdominal pain which woke him up this morning around 8:30 associated with nausea and vomiting. He denies fever, chills, but feels fatigue. He initially had diffuse abdominal tenderness which improved with administration of ondansetron and hydromorphone.  Workup in the emergency department showed a small bowel obstruction with transition in the pelvic area. An NG tube was placed and the patient was initially going to be transferred to [facility], but no beds were available at this time. He is currently abdominal pain free after 1700 mL of fluid have been suctioned. He is currently in no acute distress.   Review of Systems:  Constitutional:  Denies night sweats, Fevers, chills Positive fatigue.  HEENT:  No headaches, Difficulty swallowing,Tooth/dental problems,Sore throat,  No sneezing, itching, ear ache, nasal congestion, post nasal drip,  Cardio-vascular:  No chest pain, Orthopnea, PND, swelling in lower extremities, anasarca, dizziness, palpitations  GI:   Positive abdominal pain, nausea, emesis, decreased appetite. Denies diarrhea, melena or hematochezia. Resp:  No shortness of breath with exertion or at rest. No excess mucus, no productive cough, No non-productive cough, No coughing up of blood.No change in color of mucus.No wheezing. No chest wall deformity  Skin:  no rash or lesions.  GU:   No dysuria, change in color of urine, no urgency or frequency. No flank pain.  Musculoskeletal:  No joint pain or swelling. No decreased range of motion. No back pain.  Psych:  No change in mood or affect. No depression or anxiety. No memory loss.   Past Medical History  Diagnosis Date  . History of prostate cancer     s/p  radioactive seed implants  in 2013  . Bladder cancer ([place])   . Frequency of urination   . Urgency of urination   . Hematuria   . Nocturia   . H/O gastroesophageal reflux (GERD)   . Coronary artery disease 10/30/2014    BMS to LAD   . Hypotension   . Autoimmune disorder [facility])     Being followed by Neurology  . Myocardial infarction ([place])   . Pancreatitis   . Renal insufficiency    Past Surgical History  Procedure Laterality Date  . Radioactive prostate seed implants  2013  . Transurethral resection of bladder tumor N/A 06/26/2013    Procedure: TRANSURETHRAL RESECTION OF BLADDER TUMOR (TURBT), POSSIBLE INSTILLATION OF MITOMYCIN C;  Surgeon: [name], MD;  Location: [facility];  Service: Urology;  Laterality: N/A;  **OUTPATIENT WITH OBSERVATION** (POSSIBLE)   . Cardiac catheterization  10/30/14    BARE METAL STENT TO LAD  . Prostatectomy  09/06/2014  . Ileostomy    . Nephrostomy    . Abdominal surgery     Social History:  reports that he quit smoking about 36 years ago. His smoking use included Cigarettes. He has never used smokeless tobacco. He reports that he does not drink alcohol or use illicit drugs.  No Known Allergies  Family  History  Problem Relation Age of Onset  . Melanoma Mother   . Cancer Father   . Thyroid disease Sister   . Thyroid disease Sister   . Coronary artery disease Father     Prior to Admission medications   Medication Sig Start Date End Date Taking? Authorizing Provider  aspirin EC 81 MG tablet Take 1 tablet (81 mg total) by mouth daily. 11/14/14  Yes Bhavinkumar Bhagat, PA  atezolizumab 1,200 mg in  sodium chloride 0.9 % 250 mL Inject into the vein. Last infusion was 01-04-15. Next treatment is pending. 01/04/15  Yes Historical Provider, MD  carvedilol (COREG) 3.125 MG tablet Take 1 tablet (3.125 mg total) by mouth every morning. 11/09/14  Yes Josue Hector, MD  Cyanocobalamin (VITAMIN B-12 PO) Take 1 tablet by mouth every morning.   Yes Historical Provider, MD  docusate sodium (COLACE) 100 MG capsule Take 100 mg by mouth every morning.   Yes Historical Provider, MD  gabapentin (NEURONTIN) 300 MG capsule Take 300-600 mg by mouth 3 (three) times daily. 300mg  in the morning (0800), 600 in the afternoon (1430), 600 in the evening (2200)   Yes Historical Provider, MD  pantoprazole (PROTONIX) 40 MG tablet Take 1 tablet (40 mg total) by mouth daily. 11/14/14  Yes Bhavinkumar Bhagat, PA  prasugrel (EFFIENT) 10 MG TABS tablet Take 1 tablet (10 mg total) by mouth daily. 11/09/14  Yes Josue Hector, MD  Sennosides ([employer] SENNA-EXTRA) 17.2 MG TABS Take 17.2 mg by mouth every evening.    Yes Historical Provider, MD  atorvastatin (LIPITOR) 40 MG tablet Take 1 tablet (40 mg total) by mouth at bedtime. 11/09/14   Josue Hector, MD  nitroGLYCERIN (NITROSTAT) 0.4 MG SL tablet Place 1 tablet (0.4 mg total) under the tongue every 5 (five) minutes x 3 doses as needed for chest pain. 11/14/14   Leanor Kail, PA   Physical Exam: Filed Vitals:   01/18/15 2300 01/18/15 2330 01/19/15 0000 01/19/15 0030  BP: 116/88 115/87 107/84 130/95  Pulse: 97 98 96 101  Temp:      TempSrc:      Resp: 15 14 15 18   SpO2: 94% 94% 94% 98%    Wt Readings from Last 3 Encounters:  11/14/14 80.967 kg (178 lb 8 oz)  11/09/14 83.008 kg (183 lb)  02/05/14 92.987 kg (205 lb)    General:  Appears calm and comfortable Eyes: PERRL, normal lids, irises & conjunctiva ENT: grossly normal hearing, NGT in place, lips & tongue Neck: no LAD, masses or thyromegaly Cardiovascular: RRR, no m/r/g. No LE edema. Telemetry: SR, no arrhythmias    Respiratory: CTA bilaterally, no w/r/r. Normal respiratory effort. Abdomen: Urostomy right lower quadrant. Nephrostomy on left flank area. Bowel sounds are hypoactive, soft, no tenderness on palpation. Skin: no rash or induration seen on limited exam Musculoskeletal: grossly normal tone BUE/BLE Psychiatric: grossly normal mood and affect, speech fluent and appropriate Neurologic: grossly non-focal.          Labs on Admission:  Basic Metabolic Panel:  Recent Labs Lab 01/18/15 1155  NA 140  K 3.8  CL 105  CO2 25  GLUCOSE 154*  BUN 17  CREATININE 1.53*  CALCIUM 9.6   Liver Function Tests:  Recent Labs Lab 01/18/15 1155  AST 22  ALT 13*  ALKPHOS 72  BILITOT 0.9  PROT 7.1  ALBUMIN 3.7    Recent Labs Lab 01/18/15 1155  LIPASE 51   CBC:  Recent Labs Lab 01/18/15 1155  WBC 7.9  NEUTROABS 6.7  HGB 11.0*  HCT 34.9*  MCV 83.7  PLT 267    Radiological Exams on Admission: Ct Abdomen Pelvis W Contrast  01/18/2015  CLINICAL DATA:  Sudden onset of diffuse abdominal pain, history metastatic bladder cancer, prostate cancer, coronary artery disease post MI, LEFT nephrostomy tube, ileostomy EXAM: CT ABDOMEN AND PELVIS WITH CONTRAST TECHNIQUE: Multidetector CT imaging of the abdomen and pelvis was performed using the standard protocol following bolus administration of intravenous contrast. Sagittal and coronal MPR images reconstructed from axial data set. CONTRAST:  61mL OMNIPAQUE IOHEXOL 300 MG/ML SOLN IV. Dilute oral contrast. COMPARISON:  None FINDINGS: Small RIGHT pleural effusion. Multiple enlarged lymph nodes identified including: RIGHT hilar versus mediastinal nodal mass 6.2 x 5.2 cm. Inferior mediastinal para-aortic node 2.1 cm short axis image 15 Retrocrural node 2.0 cm short axis image 22 Celiac axis node 1.5 cm short axis image 27 LEFT para-aortic node 1.6 cm short axis image 57 Additionally, multiple retroperitoneal soft tissue nodules including: Medial to spleen  LEFT subdiaphragmatic 2.4 x 2.2 cm image 25 Perisplenic 5.5 x 4.1 cm image 31 LEFT posterior para renal invading abdominal wall adjacent to nephrostomy tube 3.1 x 2.7 cm LEFT para-aortic mass versus node at the aortic bifurcation 4.7 cm short axis image 63. RIGHT upper quadrant soft tissue mass anterior to liver, question arising from abdominal wall or peritoneal surface 5.1 x 4.3 cm image 22. Perisplenic metastasis 2.3 x 2.3 cm image 31. CBD stent and LEFT nephrostomy tube. RIGHT lower quadrant ostomy, question urostomy. Question mass at pancreatic head question metastasis 1.5 x 1.9 cm image 39. Probable obstruction of the LEFT ureter by an above retroperitoneal mass, decompressed by nephrostomy tube. Liver, gallbladder, spleen, pancreas, kidneys, and adrenal glands otherwise normal. Stomach and proximal small bowel loops distended with decompressed distal small bowel consistent with small bowel obstruction, question transition zone in the anterior mid pelvis. Normal appendix. Colon decompressed. No free air or free fluid. No osseous metastatic lesions. Probable coronary arterial stent. IMPRESSION: Scattered adenopathy and soft tissue masses as described above compatible with widespread metastatic disease. Obstruction of mid LEFT ureter by tumor with LEFT nephrostomy tube decompressing LEFT renal collecting system. Small bowel obstruction question transition in mid pelvis. RIGHT pleural effusion. Electronically Signed   By: Lavonia Dana M.D.   On: 01/18/2015 13:47    EKG: Independently reviewed. Vent. rate 98 BPM PR interval 150 ms QRS duration 80 ms QT/QTc 352/449 ms P-R-T axes 67 98 91 Normal sinus rhythm Rightward axis Cannot rule out Anteroseptal infarct , age undetermined  Assessment/Plan Principal Problem:   SBO (small bowel obstruction) (HCC) The patient has had significant relief after 1700 mils of fluid have been suctioned. He states that he currently does not have any pain. Continue  nasogastric tube suctioning. Continue IV fluids with potassium supplementation and consider discontinuing NG tube suctioning in the morning and oral intake trial if the patient continues to be pain free.  Active Problems:   Bladder cancer [facility]) Continue immunotherapy as scheduled at [facility].    CAD (coronary artery disease) Resume aspirin, beta blocker and Effient once the patient is tolerating oral intake.    Hyperlipidemia Resume atorvastatin once the patient is tolerating oral intake.    AKI (acute kidney injury) ([place]) Continue IV hydration. Follow-up creatinine level in the morning.    Anemia Monitor hematocrit and hemoglobin.   Code Status: Full code. DVT Prophylaxis: Mechanical with SCDs. Family Communication: His son Dorothea Ogle was present  in the room. Disposition Plan: Admit to continue nasogastric tube suction.  Time spent: Over 70 minutes were spent in the process of his admission.  Reubin Milan [facility] Pager [phone].

## 2015-01-19 NOTE — Progress Notes (Addendum)
PATIENT DETAILS Name: Daniel Peterson Age: 64 y.o. Sex: male Date of Birth: 14-Feb-1951 Admit Date: 01/18/2015 Admitting Physician Reubin Milan, MD PCP:No PCP Per Patient  Subjective: Passed flatus this morning, no further abdominal pain.NGT in place  Assessment/Plan: Principal Problem: SBO (small bowel obstruction): Seems to have improved somewhat after placement of NG tube. Abdomen is soft, with sluggish bowel sounds. We will check 2 view x-ray to see if contrast that the patient received with CT scan yesterday has passed through into the large bowel. General surgery consulted-if does not improve and needs surgery-needs to be transferred to [facility]  Active Problems: Chronic systolic heart failure (EF by TTE August 2016 35-45%): Clinically compensated, decrease IV fluids and watch for any signs of volume overload.  History of CAD status post Anterior MI - 10/30/2014 - BMS to LAD: Since nothing by mouth and actively vomiting on admission-both aspirin and Effient were discontinued. We will resume aspirin per rectally. Spoke with cardiology-Dr. Kelly Splinter advised to hold Effient in this setting as >1 month out of PCI and has BMS.Once improves and thought not to require surgery-Will restart Effient   ARF: Likely prerenal azotemia- Resolved with IV fluids  Anemia: Suspect secondary to chronic disease. Follow periodically.  Dyslipidemia: Resume Lipitor once oral intake has resumed  History of metastatic bladder cancer-status post cystectomy with urinary diversion/urostomy-left nephrostomy in place: Currently on immunotherapy, follows [facility]  History of prostate cancer-status post prostatectomy   Disposition: Remain inpatient  Antimicrobial agents  See below  Anti-infectives    None      DVT Prophylaxis: Prophylactic Lovenox   Code Status: Full code   Family Communication Spouse at bedside  Procedures: None  CONSULTS:  general  surgery  Time spent 30 minutes-Greater than 50% of this time was spent in counseling, explanation of diagnosis, planning of further management, and coordination of care.  MEDICATIONS: Scheduled Meds: . aspirin  300 mg Rectal Daily  . sodium chloride  3 mL Intravenous Q12H   Continuous Infusions: . 0.9 % NaCl with KCl 40 mEq / L 75 mL/hr (01/19/15 1147)   PRN Meds:.HYDROmorphone (DILAUDID) injection, ondansetron   PHYSICAL EXAM: Vital signs in last 24 hours: Filed Vitals:   01/19/15 0030 01/19/15 0100 01/19/15 0131 01/19/15 0602  BP: 130/95 113/84 116/78 105/77  Pulse: 101 97 95 93  Temp:   99.2 F (37.3 C) 99 F (37.2 C)  TempSrc:   Oral Oral  Resp: 18 14 15 15   Height:   6\' 2"  (1.88 m)   Weight:   80.831 kg (178 lb 3.2 oz)   SpO2: 98% 95% 98% 96%    Weight change:  Filed Weights   01/19/15 0131  Weight: 80.831 kg (178 lb 3.2 oz)   Body mass index is 22.87 kg/(m^2).   Gen Exam: Awake and alert with clear speech. NGT in place Neck: Supple, No JVD.   Chest: B/L Clear.   CVS: S1 S2 Regular, no murmurs.  Abdomen: soft, BS + but s, non tender, non distended.  Extremities: no edema, lower extremities warm to touch. Neurologic: Non Focal.   Skin: No Rash.   Wounds: N/A.    Intake/Output from previous day:  Intake/Output Summary (Last 24 hours) at 01/19/15 1336 Last data filed at 01/19/15 0603  Gross per 24 hour  Intake 1775.92 ml  Output   2795 ml  Net -1019.08 ml  LAB RESULTS: CBC  Recent Labs Lab 01/18/15 1155 01/19/15 0400  WBC 7.9 7.9  HGB 11.0* 10.3*  HCT 34.9* 33.0*  PLT 267 283  MCV 83.7 82.9  MCH 26.4 25.9*  MCHC 31.5 31.2  RDW 17.9* 18.2*  LYMPHSABS 0.4* 0.3*  MONOABS 0.5 0.8  EOSABS 0.2 0.0  BASOSABS 0.0 0.0    Chemistries   Recent Labs Lab 01/18/15 1155 01/19/15 0400  NA 140 143  K 3.8 4.5  CL 105 108  CO2 25 23  GLUCOSE 154* 120*  BUN 17 21*  CREATININE 1.53* 1.22  CALCIUM 9.6 9.1  MG  --  1.8    CBG: No  results for input(s): GLUCAP in the last 168 hours.  GFR Estimated Creatinine Clearance: 69.9 mL/min (by C-G formula based on Cr of 1.22).  Coagulation profile No results for input(s): INR, PROTIME in the last 168 hours.  Cardiac Enzymes No results for input(s): CKMB, TROPONINI, MYOGLOBIN in the last 168 hours.  Invalid input(s): CK  Invalid input(s): POCBNP No results for input(s): DDIMER in the last 72 hours. No results for input(s): HGBA1C in the last 72 hours. No results for input(s): CHOL, HDL, LDLCALC, TRIG, CHOLHDL, LDLDIRECT in the last 72 hours. No results for input(s): TSH, T4TOTAL, T3FREE, THYROIDAB in the last 72 hours.  Invalid input(s): FREET3 No results for input(s): VITAMINB12, FOLATE, FERRITIN, TIBC, IRON, RETICCTPCT in the last 72 hours.  Recent Labs  01/18/15 1155  LIPASE 51    Urine Studies No results for input(s): UHGB, CRYS in the last 72 hours.  Invalid input(s): UACOL, UAPR, USPG, UPH, UTP, UGL, UKET, UBIL, UNIT, UROB, ULEU, UEPI, UWBC, URBC, UBAC, CAST, UCOM, BILUA  MICROBIOLOGY: No results found for this or any previous visit (from the past 240 hour(s)).  RADIOLOGY STUDIES/RESULTS: Ct Abdomen Pelvis W Contrast  01/18/2015  CLINICAL DATA:  Sudden onset of diffuse abdominal pain, history metastatic bladder cancer, prostate cancer, coronary artery disease post MI, LEFT nephrostomy tube, ileostomy EXAM: CT ABDOMEN AND PELVIS WITH CONTRAST TECHNIQUE: Multidetector CT imaging of the abdomen and pelvis was performed using the standard protocol following bolus administration of intravenous contrast. Sagittal and coronal MPR images reconstructed from axial data set. CONTRAST:  39mL OMNIPAQUE IOHEXOL 300 MG/ML SOLN IV. Dilute oral contrast. COMPARISON:  None FINDINGS: Small RIGHT pleural effusion. Multiple enlarged lymph nodes identified including: RIGHT hilar versus mediastinal nodal mass 6.2 x 5.2 cm. Inferior mediastinal para-aortic node 2.1 cm short axis  image 15 Retrocrural node 2.0 cm short axis image 22 Celiac axis node 1.5 cm short axis image 27 LEFT para-aortic node 1.6 cm short axis image 57 Additionally, multiple retroperitoneal soft tissue nodules including: Medial to spleen LEFT subdiaphragmatic 2.4 x 2.2 cm image 25 Perisplenic 5.5 x 4.1 cm image 31 LEFT posterior para renal invading abdominal wall adjacent to nephrostomy tube 3.1 x 2.7 cm LEFT para-aortic mass versus node at the aortic bifurcation 4.7 cm short axis image 63. RIGHT upper quadrant soft tissue mass anterior to liver, question arising from abdominal wall or peritoneal surface 5.1 x 4.3 cm image 22. Perisplenic metastasis 2.3 x 2.3 cm image 31. CBD stent and LEFT nephrostomy tube. RIGHT lower quadrant ostomy, question urostomy. Question mass at pancreatic head question metastasis 1.5 x 1.9 cm image 39. Probable obstruction of the LEFT ureter by an above retroperitoneal mass, decompressed by nephrostomy tube. Liver, gallbladder, spleen, pancreas, kidneys, and adrenal glands otherwise normal. Stomach and proximal small bowel loops distended with decompressed distal small bowel  consistent with small bowel obstruction, question transition zone in the anterior mid pelvis. Normal appendix. Colon decompressed. No free air or free fluid. No osseous metastatic lesions. Probable coronary arterial stent. IMPRESSION: Scattered adenopathy and soft tissue masses as described above compatible with widespread metastatic disease. Obstruction of mid LEFT ureter by tumor with LEFT nephrostomy tube decompressing LEFT renal collecting system. Small bowel obstruction question transition in mid pelvis. RIGHT pleural effusion. Electronically Signed   By: Lavonia Dana M.D.   On: 01/18/2015 13:47    Oren Binet, MD  [facility] Pager:[phone]  If 7PM-7AM, please contact night-coverage [email or web address] Password [email or web address] 01/19/2015, 1:36 PM

## 2015-01-19 NOTE — Progress Notes (Signed)
Paged MD as requested by pt's wife regarding abdominal x ray result. Waiting on return call

## 2015-01-19 NOTE — Consult Note (Signed)
[facility] Consult Note  Daniel Peterson 1950-03-18  [id number].    Requesting MD: Dr. Sloan Leiter Chief Complaint/Reason for Consult: SBO  HPI:  64 y.o. male with a PMH prostate cancer, S/P prostatectomy, metastaticbladder cancer on immunotherapy, GERD, CAD status post MI in September, S/P urostomy on the rightand left nephrostomy last month (in [place]) due to severe pyelonephritis secondary to ureteral obstruction due to metastasis.   2 Cancer surgeries including bowel resection and all other care is received at [facility] (Cardiology, GI, Heme/Onc, General Surgery and Urology - notes in care everywhere).  He presented to [facility] with complaints of severe abdominal pain/distension which woke him up this morning around 8:30 associated with nausea and vomiting.  He denies fever/chills, but feels fatigue.  Hard BM yesterday, none yet today, passing flatus.  He denies any precipitating/alleviating factors.  No radiation of pain.    Workup in the emergency department including CT scan showed a SBO with transition in the pelvic area. An NG tube was placed and the patient was initially going to be transferred to [facility], but no beds were available at this time.  He spent the night and this am was given a bed offer, but he subsequently refused transfer.  He is currently abdominal pain/distension free after 1700 mL of fluid have been suctioned.  We were consulted regarding the SBO.     ROS: All systems reviewed and otherwise negative except for as above  Family History  Problem Relation Age of Onset  . Melanoma Mother   . Cancer Father   . Thyroid disease Sister   . Thyroid disease Sister   . Coronary artery disease Father     Past Medical History  Diagnosis Date  . History of prostate cancer     s/p  radioactive seed implants  in 2013  . Bladder cancer ([place])   . Frequency of urination   . Urgency of urination   . Hematuria   . Nocturia   . H/O gastroesophageal reflux (GERD)   .  Coronary artery disease 10/30/2014    BMS to LAD   . Hypotension   . 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 Myocardial infarction ([place])   . Pancreatitis   . Renal insufficiency     Past Surgical History  Procedure Laterality Date  . Radioactive prostate seed implants  2013  . Transurethral resection of bladder tumor N/A 06/26/2013    Procedure: TRANSURETHRAL RESECTION OF BLADDER TUMOR (TURBT), POSSIBLE INSTILLATION OF MITOMYCIN C;  Surgeon: [name], MD;  Location: [facility];  Service: Urology;  Laterality: N/A;  **OUTPATIENT WITH OBSERVATION** (POSSIBLE)   . Cardiac catheterization  10/30/14    BARE METAL STENT TO LAD  . Prostatectomy  09/06/2014  . Ileostomy    . Nephrostomy    . Abdominal surgery      Social History:  reports that he quit smoking about 36 years ago. His smoking use included Cigarettes. He has never used smokeless tobacco. He reports that he does not drink alcohol or use illicit drugs.  Allergies: No Known Allergies  Medications Prior to Admission  Medication Sig Dispense Refill  . aspirin EC 81 MG tablet Take 1 tablet (81 mg total) by mouth daily. 30 tablet   . atezolizumab 1,200 mg in sodium chloride 0.9 % 250 mL Inject into the vein. Last infusion was 01-04-15. Next treatment is pending.    . carvedilol (COREG) 3.125 MG tablet Take  1 tablet (3.125 mg total) by mouth every morning. 90 tablet 3  . Cyanocobalamin (VITAMIN B-12 PO) Take 1 tablet by mouth every morning.    . docusate sodium (COLACE) 100 MG capsule Take 100 mg by mouth every morning.    . gabapentin (NEURONTIN) 300 MG capsule Take 300-600 mg by mouth 3 (three) times daily. 34m in the morning (0800), 600 in the afternoon (1430), 600 in the evening (2200)    . pantoprazole (PROTONIX) 40 MG tablet Take 1 tablet (40 mg total) by mouth daily. 30 tablet 6  . prasugrel (EFFIENT) 10 MG TABS tablet Take 1 tablet (10 mg total) by mouth daily. 90 tablet 3  .  Sennosides ([employer] SENNA-EXTRA) 17.2 MG TABS Take 17.2 mg by mouth every evening.     .Marland Kitchenatorvastatin (LIPITOR) 40 MG tablet Take 1 tablet (40 mg total) by mouth at bedtime. 90 tablet 3  . nitroGLYCERIN (NITROSTAT) 0.4 MG SL tablet Place 1 tablet (0.4 mg total) under the tongue every 5 (five) minutes x 3 doses as needed for chest pain. 25 tablet 12    Blood pressure 105/77, pulse 93, temperature 99 F (37.2 C), temperature source Oral, resp. rate 15, height 6' 2"  (1.88 m), weight 80.831 kg (178 lb 3.2 oz), SpO2 96 %. Physical Exam: General: pleasant, cachectic, WD/WN white male who is laying in bed in NAD HEENT: head is normocephalic, atraumatic.  Sclera are noninjected.  PERRL.  Ears and nose without any masses or lesions.  Mouth is pink and moist Heart: regular, rate, and rhythm.  No obvious murmurs, gallops, or rubs noted.  Palpable pedal pulses bilaterally Lungs: CTAB, no wheezes, rhonchi, or rales noted.  Respiratory effort nonlabored Abd: soft, NT, minimal distension, +BS, no masses, hernias, or organomegaly, Urostomy in RLQ with minimal output in bag, nephrostomy tube left flank with yellow urine MS: all 4 extremities are symmetrical with no cyanosis, clubbing, or edema. Skin: warm and dry with no masses, lesions, or rashes Psych: A&Ox3 with an appropriate affect.   Results for orders placed or performed during the hospital encounter of 01/18/15 (from the past 48 hour(s))  CBC with Differential/Platelet     Status: Abnormal   Collection Time: 01/18/15 11:55 AM  Result Value Ref Range   WBC 7.9 4.0 - 10.5 K/uL   RBC 4.17 (L) 4.22 - 5.81 MIL/uL   Hemoglobin 11.0 (L) 13.0 - 17.0 g/dL   HCT 34.9 (L) 39.0 - 52.0 %   MCV 83.7 78.0 - 100.0 fL   MCH 26.4 26.0 - 34.0 pg   MCHC 31.5 30.0 - 36.0 g/dL   RDW 17.9 (H) 11.5 - 15.5 %   Platelets 267 150 - 400 K/uL   Neutrophils Relative % 85 %   Neutro Abs 6.7 1.7 - 7.7 K/uL   Lymphocytes Relative 6 %   Lymphs Abs 0.4 (L) 0.7 - 4.0 K/uL    Monocytes Relative 6 %   Monocytes Absolute 0.5 0.1 - 1.0 K/uL   Eosinophils Relative 3 %   Eosinophils Absolute 0.2 0.0 - 0.7 K/uL   Basophils Relative 0 %   Basophils Absolute 0.0 0.0 - 0.1 K/uL  Comprehensive metabolic panel     Status: Abnormal   Collection Time: 01/18/15 11:55 AM  Result Value Ref Range   Sodium 140 135 - 145 mmol/L   Potassium 3.8 3.5 - 5.1 mmol/L   Chloride 105 101 - 111 mmol/L   CO2 25 22 - 32 mmol/L   Glucose, Bld 154 (  H) 65 - 99 mg/dL   BUN 17 6 - 20 mg/dL   Creatinine, Ser 1.53 (H) 0.61 - 1.24 mg/dL   Calcium 9.6 8.9 - 10.3 mg/dL   Total Protein 7.1 6.5 - 8.1 g/dL   Albumin 3.7 3.5 - 5.0 g/dL   AST 22 15 - 41 U/L   ALT 13 (L) 17 - 63 U/L   Alkaline Phosphatase 72 38 - 126 U/L   Total Bilirubin 0.9 0.3 - 1.2 mg/dL   GFR calc non Af Amer 46 (L) >60 mL/min   GFR calc Af Amer 54 (L) >60 mL/min    Comment: (NOTE) The eGFR has been calculated using the CKD EPI equation. This calculation has not been validated in all clinical situations. eGFR's persistently <60 mL/min signify possible Chronic Kidney Disease.    Anion gap 10 5 - 15  Lipase, blood     Status: None   Collection Time: 01/18/15 11:55 AM  Result Value Ref Range   Lipase 51 11 - 51 U/L  I-Stat CG4 Lactic Acid, ED     Status: None   Collection Time: 01/18/15 12:04 PM  Result Value Ref Range   Lactic Acid, Venous 1.81 0.5 - 2.0 mmol/L  Comprehensive metabolic panel     Status: Abnormal   Collection Time: 01/19/15  4:00 AM  Result Value Ref Range   Sodium 143 135 - 145 mmol/L   Potassium 4.5 3.5 - 5.1 mmol/L   Chloride 108 101 - 111 mmol/L   CO2 23 22 - 32 mmol/L   Glucose, Bld 120 (H) 65 - 99 mg/dL   BUN 21 (H) 6 - 20 mg/dL   Creatinine, Ser 1.22 0.61 - 1.24 mg/dL   Calcium 9.1 8.9 - 10.3 mg/dL   Total Protein 6.3 (L) 6.5 - 8.1 g/dL   Albumin 3.1 (L) 3.5 - 5.0 g/dL   AST 20 15 - 41 U/L   ALT 13 (L) 17 - 63 U/L   Alkaline Phosphatase 63 38 - 126 U/L   Total Bilirubin 0.9 0.3 - 1.2  mg/dL   GFR calc non Af Amer >60 >60 mL/min   GFR calc Af Amer >60 >60 mL/min    Comment: (NOTE) The eGFR has been calculated using the CKD EPI equation. This calculation has not been validated in all clinical situations. eGFR's persistently <60 mL/min signify possible Chronic Kidney Disease.    Anion gap 12 5 - 15  CBC WITH DIFFERENTIAL     Status: Abnormal   Collection Time: 01/19/15  4:00 AM  Result Value Ref Range   WBC 7.9 4.0 - 10.5 K/uL   RBC 3.98 (L) 4.22 - 5.81 MIL/uL   Hemoglobin 10.3 (L) 13.0 - 17.0 g/dL   HCT 33.0 (L) 39.0 - 52.0 %   MCV 82.9 78.0 - 100.0 fL   MCH 25.9 (L) 26.0 - 34.0 pg   MCHC 31.2 30.0 - 36.0 g/dL   RDW 18.2 (H) 11.5 - 15.5 %   Platelets 283 150 - 400 K/uL   Neutrophils Relative % 86 %   Neutro Abs 6.8 1.7 - 7.7 K/uL   Lymphocytes Relative 4 %   Lymphs Abs 0.3 (L) 0.7 - 4.0 K/uL   Monocytes Relative 10 %   Monocytes Absolute 0.8 0.1 - 1.0 K/uL   Eosinophils Relative 0 %   Eosinophils Absolute 0.0 0.0 - 0.7 K/uL   Basophils Relative 0 %   Basophils Absolute 0.0 0.0 - 0.1 K/uL  Magnesium  Status: None   Collection Time: 01/19/15  4:00 AM  Result Value Ref Range   Magnesium 1.8 1.7 - 2.4 mg/dL  Phosphorus     Status: Abnormal   Collection Time: 01/19/15  4:00 AM  Result Value Ref Range   Phosphorus 4.7 (H) 2.5 - 4.6 mg/dL   Ct Abdomen Pelvis W Contrast  01/18/2015  CLINICAL DATA:  Sudden onset of diffuse abdominal pain, history metastatic bladder cancer, prostate cancer, coronary artery disease post MI, LEFT nephrostomy tube, ileostomy EXAM: CT ABDOMEN AND PELVIS WITH CONTRAST TECHNIQUE: Multidetector CT imaging of the abdomen and pelvis was performed using the standard protocol following bolus administration of intravenous contrast. Sagittal and coronal MPR images reconstructed from axial data set. CONTRAST:  72m OMNIPAQUE IOHEXOL 300 MG/ML SOLN IV. Dilute oral contrast. COMPARISON:  None FINDINGS: Small RIGHT pleural effusion. Multiple  enlarged lymph nodes identified including: RIGHT hilar versus mediastinal nodal mass 6.2 x 5.2 cm. Inferior mediastinal para-aortic node 2.1 cm short axis image 15 Retrocrural node 2.0 cm short axis image 22 Celiac axis node 1.5 cm short axis image 27 LEFT para-aortic node 1.6 cm short axis image 57 Additionally, multiple retroperitoneal soft tissue nodules including: Medial to spleen LEFT subdiaphragmatic 2.4 x 2.2 cm image 25 Perisplenic 5.5 x 4.1 cm image 31 LEFT posterior para renal invading abdominal wall adjacent to nephrostomy tube 3.1 x 2.7 cm LEFT para-aortic mass versus node at the aortic bifurcation 4.7 cm short axis image 63. RIGHT upper quadrant soft tissue mass anterior to liver, question arising from abdominal wall or peritoneal surface 5.1 x 4.3 cm image 22. Perisplenic metastasis 2.3 x 2.3 cm image 31. CBD stent and LEFT nephrostomy tube. RIGHT lower quadrant ostomy, question urostomy. Question mass at pancreatic head question metastasis 1.5 x 1.9 cm image 39. Probable obstruction of the LEFT ureter by an above retroperitoneal mass, decompressed by nephrostomy tube. Liver, gallbladder, spleen, pancreas, kidneys, and adrenal glands otherwise normal. Stomach and proximal small bowel loops distended with decompressed distal small bowel consistent with small bowel obstruction, question transition zone in the anterior mid pelvis. Normal appendix. Colon decompressed. No free air or free fluid. No osseous metastatic lesions. Probable coronary arterial stent. IMPRESSION: Scattered adenopathy and soft tissue masses as described above compatible with widespread metastatic disease. Obstruction of mid LEFT ureter by tumor with LEFT nephrostomy tube decompressing LEFT renal collecting system. Small bowel obstruction question transition in mid pelvis. RIGHT pleural effusion. Electronically Signed   By: MLavonia DanaM.D.   On: 01/18/2015 13:47      Assessment/Plan SBO - hopefully this is only partial Recent  MI - 11/2014 on Effient and aspirin Prostate cancer with metastasis to bladder [facility]  [facility] to see if contrast has made it to colon.  If SBO is resolving (he does have flatus and Bowel sounds), then he can stay here if continuing to improve.  If no improvement on xray [facility]  Our surgeons would not take on his care surgically since everything has been done there.  He would be best served at higher level of care with the surgeons who have already operated on him if surgical intervention is needed. 2.  Medical management for now - NG tube, NPO, bowel rest, IVF, pain control, antiemetics 3.  SCD's, and hold effient/aspirin   [facility] 01/19/2015, 11:56 AM Pager: [phone]

## 2015-01-19 NOTE — Progress Notes (Signed)
Wife says pt's ileostomy has not been changed in 4 days. Usually changed after 3 days. Offered to change ileostomy at this time but pt says he wants to wait

## 2015-01-19 NOTE — Progress Notes (Signed)
Aspirin suppository self administered by pt

## 2015-01-19 NOTE — ED Notes (Signed)
Ortiz, MD at bedside.  

## 2015-01-20 ENCOUNTER — Inpatient Hospital Stay (HOSPITAL_COMMUNITY): Payer: BLUE CROSS/BLUE SHIELD

## 2015-01-20 DIAGNOSIS — D6489 Other specified anemias: Secondary | ICD-10-CM

## 2015-01-20 DIAGNOSIS — N179 Acute kidney failure, unspecified: Secondary | ICD-10-CM

## 2015-01-20 DIAGNOSIS — E785 Hyperlipidemia, unspecified: Secondary | ICD-10-CM

## 2015-01-20 DIAGNOSIS — I2510 Atherosclerotic heart disease of native coronary artery without angina pectoris: Secondary | ICD-10-CM

## 2015-01-20 DIAGNOSIS — C679 Malignant neoplasm of bladder, unspecified: Secondary | ICD-10-CM

## 2015-01-20 LAB — BASIC METABOLIC PANEL
ANION GAP: 9 (ref 5–15)
BUN: 23 mg/dL — ABNORMAL HIGH (ref 6–20)
CALCIUM: 9.2 mg/dL (ref 8.9–10.3)
CHLORIDE: 110 mmol/L (ref 101–111)
CO2: 25 mmol/L (ref 22–32)
Creatinine, Ser: 1.38 mg/dL — ABNORMAL HIGH (ref 0.61–1.24)
GFR calc non Af Amer: 53 mL/min — ABNORMAL LOW (ref >60)
Glucose, Bld: 104 mg/dL — ABNORMAL HIGH (ref 65–99)
Potassium: 4.4 mmol/L (ref 3.5–5.1)
Sodium: 144 mmol/L (ref 135–145)

## 2015-01-20 LAB — CBC
HCT: 29.5 % — ABNORMAL LOW (ref 39.0–52.0)
HEMOGLOBIN: 9.2 g/dL — AB (ref 13.0–17.0)
MCH: 26.2 pg (ref 26.0–34.0)
MCHC: 31.2 g/dL (ref 30.0–36.0)
MCV: 84 fL (ref 78.0–100.0)
Platelets: 283 10*3/uL (ref 150–400)
RBC: 3.51 MIL/uL — AB (ref 4.22–5.81)
RDW: 18.4 % — ABNORMAL HIGH (ref 11.5–15.5)
WBC: 6.5 10*3/uL (ref 4.0–10.5)

## 2015-01-20 MED ORDER — DEXTROSE-NACL 5-0.9 % IV SOLN
INTRAVENOUS | Status: DC
  Filled 2015-01-20: qty 1000

## 2015-01-20 MED ORDER — ASPIRIN 300 MG RE SUPP: 300.0000 mg | Freq: Every day | RECTAL | Status: AC

## 2015-01-20 MED ORDER — ONDANSETRON HCL 4 MG/2ML IJ SOLN: 4.0000 mg | Freq: Four times a day (QID) | INTRAMUSCULAR | Status: AC | PRN

## 2015-01-20 MED ORDER — HYDROMORPHONE HCL 1 MG/ML IJ SOLN: 1.0000 mg | INTRAMUSCULAR | Status: AC | PRN

## 2015-01-20 MED ORDER — ENOXAPARIN SODIUM 40 MG/0.4ML ~~LOC~~ SOLN: 40.0000 mg | SUBCUTANEOUS | Status: AC

## 2015-01-20 MED ORDER — DEXTROSE-NACL 5-0.9 % IV SOLN: INTRAVENOUS | Status: AC

## 2015-01-20 MED ORDER — POTASSIUM CHLORIDE IN NACL 40-0.9 MEQ/L-% IV SOLN: INTRAVENOUS | Status: DC

## 2015-01-20 NOTE — Progress Notes (Signed)
Ben Bhunu called [facility] to see if bed available, none at this time, hopeful for this afternoon.  Wife informed.  Will keep checking to see about bed availability and [facility] knows to call us just as soon as bed available.  Dr. Sloan Leiter has filled out paperwork for transfer and pt will go via [place].  Dr. Evonnie Pat will be accepting MD at [facility].

## 2015-01-20 NOTE — Progress Notes (Signed)
Called [facility] again regarding bed availability but informed that there is still no open bed available. Message conveyed to pt and his wife. Wife not very happy about possible evening transfer

## 2015-01-20 NOTE — Progress Notes (Signed)
Pt just now left unit for transfer to [facility].

## 2015-01-20 NOTE — Progress Notes (Signed)
Bed available at [facility] room 5204. Report given to [facility]

## 2015-01-20 NOTE — Discharge Summary (Addendum)
PATIENT DETAILS Name: Daniel Peterson Age: 64 y.o. Sex: male Date of Birth: 01/14/1951 MRN: [id number]. Admitting Physician: Reubin Milan, MD PCP:No PCP Per Patient  Admit Date: 01/18/2015 Discharge date: 01/20/2015  Recommendations for Outpatient Follow-up:  1. Being transferred to [facility] DIAGNOSIS:  Principal Problem:   SBO (small bowel obstruction) (HCC) Active Problems:   Bladder cancer (HCC)   CAD (coronary artery disease)   Hyperlipidemia   AKI (acute kidney injury) ([place])   Anemia      PAST MEDICAL HISTORY: Past Medical History  Diagnosis Date  . History of prostate cancer     s/p  radioactive seed implants  in 2013  . Bladder cancer ([place])   . Frequency of urination   . Urgency of urination   . Hematuria   . Nocturia   . H/O gastroesophageal reflux (GERD)   . Coronary artery disease 10/30/2014    BMS to LAD   . Hypotension   . Autoimmune disorder [facility])     Being followed by Neurology  . Myocardial infarction ([place])   . Pancreatitis   . Renal insufficiency     DISCHARGE MEDICATIONS: Current Discharge Medication List    START taking these medications   Details  aspirin 300 MG suppository Place 1 suppository (300 mg total) rectally daily.    dextrose 5 % and 0.9% NaCl 5-0.9 % 1,000 mL at 40 mL/hr, Continuous Refills: 0    enoxaparin (LOVENOX) 40 MG/0.4ML injection Inject 0.4 mLs (40 mg total) into the skin daily. Qty: 0 Syringe    HYDROmorphone (DILAUDID) 1 MG/ML injection Inject 1 mL (1 mg total) into the vein every 4 (four) hours as needed for moderate pain or severe pain.    ondansetron (ZOFRAN) 4 MG/2ML SOLN injection Inject 2 mLs (4 mg total) into the vein every 6 (six) hours as needed for nausea or vomiting. Refills: 0      STOP taking these medications     aspirin EC 81 MG tablet      atezolizumab 1,200 mg in sodium chloride 0.9 % 250 mL      carvedilol (COREG) 3.125 MG tablet      Cyanocobalamin  (VITAMIN B-12 PO)      docusate sodium (COLACE) 100 MG capsule      gabapentin (NEURONTIN) 300 MG capsule      pantoprazole (PROTONIX) 40 MG tablet      prasugrel (EFFIENT) 10 MG TABS tablet      Sennosides ([employer] SENNA-EXTRA) 17.2 MG TABS      atorvastatin (LIPITOR) 40 MG tablet      nitroGLYCERIN (NITROSTAT) 0.4 MG SL tablet         ALLERGIES:  No Known Allergies  BRIEF HPI:  See H&P, Labs, Consult and Test reports for all details in brief, patient is a 64 year old male with history of metastatic bladder cancer on immunotherapy, history of CAD on dual antiplatelet agents-who presented to the hospital with abdominal pain and vomiting, further evaluation revealed a small bowel obstruction. Patient was subsequently admitted for further evaluation and treatment.  CONSULTATIONS:   general surgery  PERTINENT RADIOLOGIC STUDIES: Ct Abdomen Pelvis W Contrast  01/18/2015  CLINICAL DATA:  Sudden onset of diffuse abdominal pain, history metastatic bladder cancer, prostate cancer, coronary artery disease post MI, LEFT nephrostomy tube, ileostomy EXAM: CT ABDOMEN AND PELVIS WITH CONTRAST TECHNIQUE: Multidetector CT imaging of the abdomen and pelvis was performed using the standard protocol following bolus administration of intravenous contrast.  Sagittal and coronal MPR images reconstructed from axial data set. CONTRAST:  35mL OMNIPAQUE IOHEXOL 300 MG/ML SOLN IV. Dilute oral contrast. COMPARISON:  None FINDINGS: Small RIGHT pleural effusion. Multiple enlarged lymph nodes identified including: RIGHT hilar versus mediastinal nodal mass 6.2 x 5.2 cm. Inferior mediastinal para-aortic node 2.1 cm short axis image 15 Retrocrural node 2.0 cm short axis image 22 Celiac axis node 1.5 cm short axis image 27 LEFT para-aortic node 1.6 cm short axis image 57 Additionally, multiple retroperitoneal soft tissue nodules including: Medial to spleen LEFT subdiaphragmatic 2.4 x 2.2 cm image 25 Perisplenic 5.5 x 4.1 cm  image 31 LEFT posterior para renal invading abdominal wall adjacent to nephrostomy tube 3.1 x 2.7 cm LEFT para-aortic mass versus node at the aortic bifurcation 4.7 cm short axis image 63. RIGHT upper quadrant soft tissue mass anterior to liver, question arising from abdominal wall or peritoneal surface 5.1 x 4.3 cm image 22. Perisplenic metastasis 2.3 x 2.3 cm image 31. CBD stent and LEFT nephrostomy tube. RIGHT lower quadrant ostomy, question urostomy. Question mass at pancreatic head question metastasis 1.5 x 1.9 cm image 39. Probable obstruction of the LEFT ureter by an above retroperitoneal mass, decompressed by nephrostomy tube. Liver, gallbladder, spleen, pancreas, kidneys, and adrenal glands otherwise normal. Stomach and proximal small bowel loops distended with decompressed distal small bowel consistent with small bowel obstruction, question transition zone in the anterior mid pelvis. Normal appendix. Colon decompressed. No free air or free fluid. No osseous metastatic lesions. Probable coronary arterial stent. IMPRESSION: Scattered adenopathy and soft tissue masses as described above compatible with widespread metastatic disease. Obstruction of mid LEFT ureter by tumor with LEFT nephrostomy tube decompressing LEFT renal collecting system. Small bowel obstruction question transition in mid pelvis. RIGHT pleural effusion. Electronically Signed   By: Lavonia Dana M.D.   On: 01/18/2015 13:47   Dg Chest Port 1 View  01/20/2015  CLINICAL DATA:  Dyspnea. EXAM: PORTABLE CHEST 1 VIEW COMPARISON:  11/13/2014. FINDINGS: Enteric catheter transverses the thorax, tip overlying expected location of gastric body. Right ureteral stent is partially visualized. Cardiomediastinal silhouette is normal. Mediastinal contours appear intact. There is no evidence of pneumothorax. There is an 8 cm soft tissue mass projecting inferior to the right hilum. There is also a small right pleural effusion. Osseous structures are without  acute abnormality. Soft tissues are grossly normal. IMPRESSION: 8 cm right infrahilar mass, and right pleural effusion, likely representing metastatic disease. Patient has known right hilar versus mediastinal mass which on previous abdominal CT measured 6.2 cm (01/18/2015) Left lung appears clear. Electronically Signed   By: Fidela Salisbury M.D.   On: 01/20/2015 12:18   Dg Abd 2 Views  01/20/2015  CLINICAL DATA:  Nausea, vomiting and diarrhea.  NG tube placement. EXAM: ABDOMEN - 2 VIEW COMPARISON:  Abdominal radiograph 01/19/2015. FINDINGS: NG tube tip and side-port project over the stomach. Left percutaneous nephrostomy catheter stable in position. Common bile duct stent stable in position. Persistent elevation right hemidiaphragm with heterogeneous opacities right lung base. Small right pleural effusion. Right lower quadrant ostomy. Stool within the colon. Persistent gaseous distended loops of small bowel within the central abdomen. Pelvic surgical clips. Surgical anastomoses within the pelvis. Lumbar spine degenerative changes. IMPRESSION: NG tube tip and side-port project over the stomach. Persistent gaseous distended loops of small bowel in the central abdomen which may represent ileus or small-bowel obstruction. Stool throughout the colon. Electronically Signed   By: Lovey Newcomer M.D.   On: 01/20/2015  10:04   Dg Abd Portable 1v  01/19/2015  CLINICAL DATA:  Patient with history of small bowel obstruction. Placement of NG tube. EXAM: PORTABLE ABDOMEN - 1 VIEW COMPARISON:  CT 01/18/2015 FINDINGS: NG tube tip and side-port project over left upper quadrant, likely within the stomach. Plastic common bile duct stent within the right upper quadrant. Left percutaneous nephrostomy tube. Gaseous distended loops of small bowel within the central abdomen. Pelvic surgical clips. Stool within the cecum and ascending colon. Supine evaluation limited for the detection of free intraperitoneal air. Lumbar spine  degenerative changes. IMPRESSION: NG tube tip and side-port project over the stomach. Persistent gaseous distended loops of small bowel compatible with known small bowel obstruction. Electronically Signed   By: Lovey Newcomer M.D.   On: 01/19/2015 14:05     PERTINENT LAB RESULTS: CBC:  Recent Labs  01/19/15 0400 01/20/15 0430  WBC 7.9 6.5  HGB 10.3* 9.2*  HCT 33.0* 29.5*  PLT 283 283   CMET CMP     Component Value Date/Time   NA 144 01/20/2015 0438   K 4.4 01/20/2015 0438   CL 110 01/20/2015 0438   CO2 25 01/20/2015 0438   GLUCOSE 104* 01/20/2015 0438   BUN 23* 01/20/2015 0438   CREATININE 1.38* 01/20/2015 0438   CALCIUM 9.2 01/20/2015 0438   PROT 6.3* 01/19/2015 0400   ALBUMIN 3.1* 01/19/2015 0400   AST 20 01/19/2015 0400   ALT 13* 01/19/2015 0400   ALKPHOS 63 01/19/2015 0400   BILITOT 0.9 01/19/2015 0400   GFRNONAA 53* 01/20/2015 0438   GFRAA >60 01/20/2015 0438    GFR Estimated Creatinine Clearance: 61.8 mL/min (by C-G formula based on Cr of 1.38).  Recent Labs  01/18/15 1155  LIPASE 51   No results for input(s): CKTOTAL, CKMB, CKMBINDEX, TROPONINI in the last 72 hours. Invalid input(s): POCBNP No results for input(s): DDIMER in the last 72 hours. No results for input(s): HGBA1C in the last 72 hours. No results for input(s): CHOL, HDL, LDLCALC, TRIG, CHOLHDL, LDLDIRECT in the last 72 hours. No results for input(s): TSH, T4TOTAL, T3FREE, THYROIDAB in the last 72 hours.  Invalid input(s): FREET3 No results for input(s): VITAMINB12, FOLATE, FERRITIN, TIBC, IRON, RETICCTPCT in the last 72 hours. Coags: No results for input(s): INR in the last 72 hours.  Invalid input(s): PT Microbiology: No results found for this or any previous visit (from the past 240 hour(s)).   BRIEF HOSPITAL COURSE:   Principal Problem: SBO (small bowel obstruction): Admitted and provided supportive care-NG tube was placed, kept nothing by mouth and started on IV fluids. Unfortunately  still with no bowel movements, although passing some minimal flatus. Abdomen remains soft and nontender, NG tube remains in place. General surgery was consulted, recommendations are to transfer to [facility] where patient gets most of his care. CT scan of the abdomen on 11/18 showed small bowel obstruction with a transition point in the mid pelvis.  Active Problems: Chronic systolic heart failure (EF by TTE August 2016 35-45%): Clinically compensated, on maintenance IV fluids-please watch for any signs of volume overload.  History of CAD status post Anterior MI - 10/30/2014 - BMS to LAD: Since nothing by mouth and actively vomiting on admission-both aspirin and Effient were discontinued. subsequently aspirin was resumed per rectally. Cardiology was curbsided--Dr. Kelly Splinter advised to hold Effient in this setting as >1 month out of PCI and has BMS.Once improves and thought not to require surgery-to restart Effient   Acute on CKD stage 2: Likely  prerenal azotemia- Resolving with IV fluids-please follow lytes closely  Anemia: Suspect secondary to chronic disease. Follow periodically.  Dyslipidemia: Resume Lipitor once oral intake has resumed  History of metastatic bladder cancer-status post cystectomy with urinary diversion/urostomy-left nephrostomy in place: Currently on immunotherapy, follows with Oncology and Urology at [facility]  History of prostate cancer-status post prostatectomy   TODAY-DAY OF DISCHARGE:  Subjective:   Daniel Peterson today has no headache,no chest abdominal pain,no new weakness tingling or numbness. Passing some flatus but no BM yet  Objective:   Blood pressure 122/86, pulse 86, temperature 98.5 F (36.9 C), temperature source Oral, resp. rate 16, height 6\' 2"  (1.88 m), weight 80.831 kg (178 lb 3.2 oz), SpO2 100 %.  Intake/Output Summary (Last 24 hours) at 01/20/15 1536 Last data filed at 01/20/15 1400  Gross per 24 hour  Intake     30 ml  Output   2650 ml  Net   -2620 ml   Filed Weights   01/19/15 0131  Weight: 80.831 kg (178 lb 3.2 oz)    Exam Awake Alert, Oriented *3, No new F.N deficits, Normal affect [place].AT,PERRAL Supple Neck,No JVD, No cervical lymphadenopathy appriciated.  Symmetrical Chest wall movement, Good air movement bilaterally, CTAB RRR,No Gallops,Rubs or new Murmurs, No Parasternal Heave +ve B.Sounds, Abd Soft, Non tender, No organomegaly appriciated, No rebound -guarding or rigidity. No Cyanosis, Clubbing or edema, No new Rash or bruise  DISCHARGE CONDITION: Stable  DISPOSITION: [employer]  DISCHARGE INSTRUCTIONS:    Activity:  As tolerated with Full fall precautions use walker/cane & assistance as needed  Get Medicines reviewed and adjusted: Please take all your medications with you for your next visit with your Primary MD  Please request your Primary MD to go over all hospital tests and procedure/radiological results at the follow up, please ask your Primary MD to get all Hospital records sent to his/her office.  If you experience worsening of your admission symptoms, develop shortness of breath, life threatening emergency, suicidal or homicidal thoughts you must seek medical attention immediately by calling 911 or calling your MD immediately  if symptoms less severe.  You must read complete instructions/literature along with all the possible adverse reactions/side effects for all the Medicines you take and that have been prescribed to you. Take any new Medicines after you have completely understood and accpet all the possible adverse reactions/side effects.   Do not drive when taking Pain medications.   Do not take more than prescribed Pain, Sleep and Anxiety Medications  Special Instructions: If you have smoked or chewed Tobacco  in the last 2 yrs please stop smoking, stop any regular Alcohol  and or any Recreational drug use.  Wear Seat belts while driving.  Please note  You were cared for by a  hospitalist during your hospital stay. Once you are discharged, your primary care physician will handle any further medical issues. Please note that NO REFILLS for any discharge medications will be authorized once you are discharged, as it is imperative that you return to your primary care physician (or establish a relationship with a primary care physician if you do not have one) for your aftercare needs so that they can reassess your need for medications and monitor your lab values.  Diet recommendation: NPO  Discharge Instructions    Increase activity slowly    Complete by:  As directed            Follow-up Information    Please follow up.  Why:  at [place] information:   Oncologist/Urologist      Total Time spent on discharge equals 45 minutes.  SignedOren Binet 01/20/2015 3:36 PM

## 2015-01-20 NOTE — Progress Notes (Signed)
PT Cancellation Note  Patient Details Name: Daniel Peterson MRN: [id number] DOB: [birth year]   Cancelled Treatment:    Reason Eval/Treat Not Completed: Other (comment)   Noting plans for transfer to [facility] today;  Will defer PT evaluation to [facility];   Roney Marion, [place]  Acute Rehabilitation Services Pager [phone] Office [phone]    Roney Marion [facility] 01/20/2015, 3:40 PM

## 2015-01-20 NOTE — Progress Notes (Signed)
2l oxygen initiated, pt reported dysnea. Vital signs within normal limits, no other physical s/s of respiratory distress noted. Skin warm and dry to touch

## 2015-01-20 NOTE — Progress Notes (Signed)
Pt has bed 5204 on [address] at [facility].  Report called by [place].

## 2015-01-20 NOTE — Progress Notes (Signed)
[facility] in process of getting pt ready for transfer to [facility]

## 2015-01-20 NOTE — Progress Notes (Signed)
[place] and spoke to [place] in pt placement and notified her that pt will be transferring to their hospital. Informed that bed may be available this pm. Information communicated to pt and his wife. Number to call is [phone]

## 2015-01-20 NOTE — Progress Notes (Signed)
Attempted to resecure NGT but pt very nervous about the ngt being touched. Secure with more tape to prevent dislodgement

## 2015-04-03 DEATH — deceased

## 2017-05-24 IMAGING — DX DG CHEST 2V
2 series · 2 of 2 positions shown · non-contrast
Comparison: None.

CLINICAL DATA: Near syncopal episode earlier today. Intermittent
nausea and dizziness for the past 2 weeks. MI with coronary stent
placement 10/30/2014. Patient currently undergoing radiation therapy
for bladder cancer. Former smoker.

EXAM:
CHEST  2 VIEW

[chest pa]
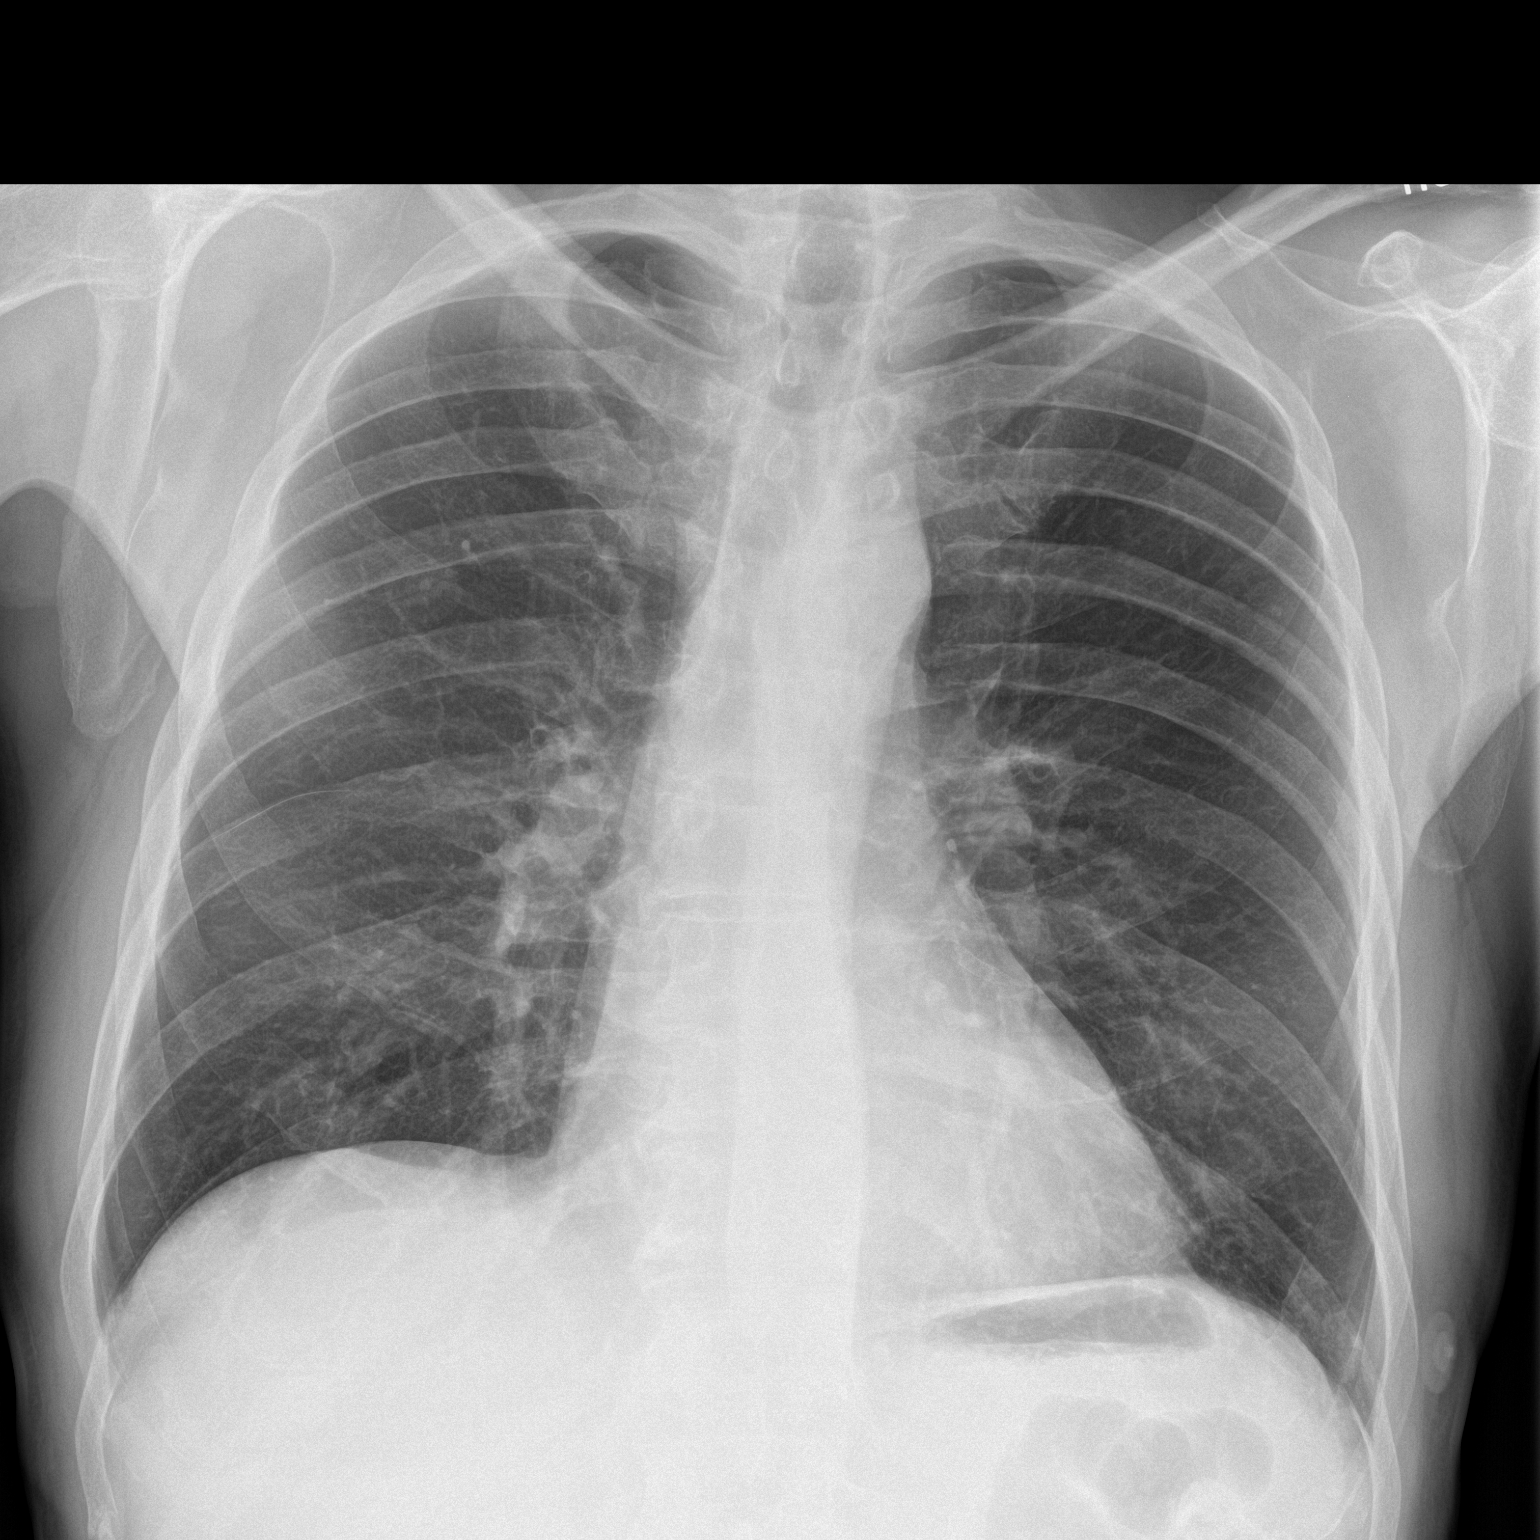

[chest lat]
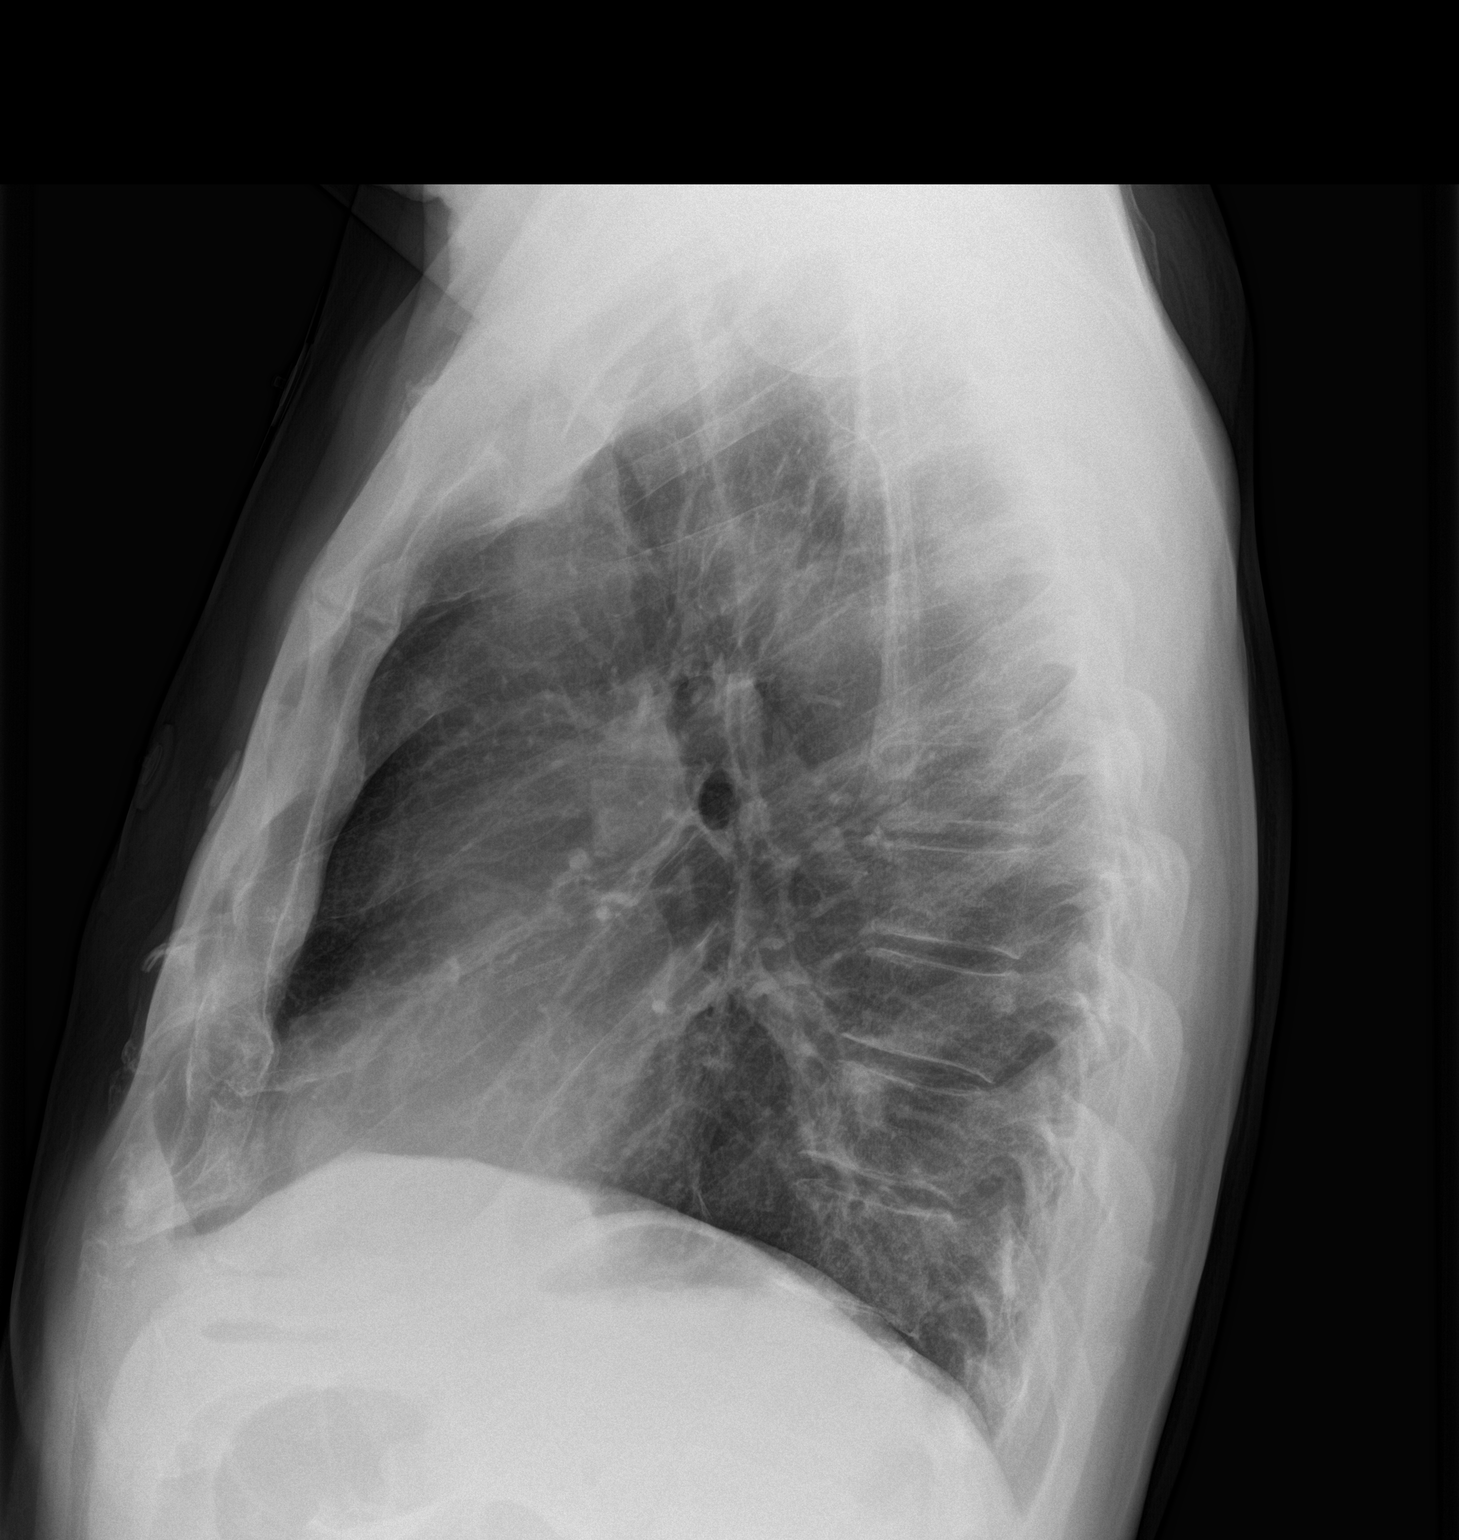

[2 of 2 positions shown; findings below may reference images not displayed]

FINDINGS: Cardiomediastinal silhouette unremarkable. Lungs clear.
Bronchovascular markings normal. Pulmonary vascularity normal. No
visible pleural effusions. No pneumothorax. Mild degenerative
changes involving the thoracic spine and slight thoracic scoliosis
convex right.
IMPRESSION: No acute cardiopulmonary disease.
